# Patient Record
Sex: Male | Born: 1990 | Race: White | Hispanic: No | Marital: Single | Smoking: Former smoker
Health system: Southern US, Community
[De-identification: ages and names within clinical notes are randomized; demographics above are authoritative.]

## PROBLEM LIST (undated history)

## (undated) DIAGNOSIS — F32A Depression, unspecified: Secondary | ICD-10-CM

## (undated) DIAGNOSIS — K589 Irritable bowel syndrome without diarrhea: Secondary | ICD-10-CM

## (undated) DIAGNOSIS — K219 Gastro-esophageal reflux disease without esophagitis: Secondary | ICD-10-CM

## (undated) DIAGNOSIS — F329 Major depressive disorder, single episode, unspecified: Secondary | ICD-10-CM

## (undated) DIAGNOSIS — A0472 Enterocolitis due to Clostridium difficile, not specified as recurrent: Secondary | ICD-10-CM

## (undated) DIAGNOSIS — R7989 Other specified abnormal findings of blood chemistry: Secondary | ICD-10-CM

## (undated) HISTORY — PX: APPENDECTOMY: SHX54

## (undated) HISTORY — DX: Enterocolitis due to Clostridium difficile, not specified as recurrent: A04.72

## (undated) HISTORY — DX: Other specified abnormal findings of blood chemistry: R79.89

## (undated) HISTORY — DX: Gastro-esophageal reflux disease without esophagitis: K21.9

## (undated) HISTORY — DX: Irritable bowel syndrome, unspecified: K58.9

## (undated) HISTORY — DX: Depression, unspecified: F32.A

## (undated) HISTORY — DX: Major depressive disorder, single episode, unspecified: F32.9

---
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
## 2015-04-24 ENCOUNTER — Telehealth: Payer: Self-pay | Admitting: Internal Medicine

## 2015-04-24 NOTE — Telephone Encounter (Signed)
Tried to contact patient mom to notify we have scheduled patient for 3/3 at 1pm

## 2015-04-24 NOTE — Telephone Encounter (Signed)
States she has seen Dr. Lawerance Bach and wants her son to get in with Dr. Lawerance Bach.  He currently just moved her from [place] and only has a few weeks of testosterone left.  She is requesting Dr. Lawerance Bach to work her son in sooner for an establish care visit.  Please advise.

## 2015-05-03 ENCOUNTER — Other Ambulatory Visit (INDEPENDENT_AMBULATORY_CARE_PROVIDER_SITE_OTHER): Payer: PRIVATE HEALTH INSURANCE

## 2015-05-03 ENCOUNTER — Encounter: Payer: Self-pay | Admitting: Internal Medicine

## 2015-05-03 ENCOUNTER — Ambulatory Visit (INDEPENDENT_AMBULATORY_CARE_PROVIDER_SITE_OTHER): Payer: PRIVATE HEALTH INSURANCE | Admitting: Internal Medicine

## 2015-05-03 VITALS — BP 124/82 | HR 67 | Temp 98.4°F | Resp 16 | Ht 66.5 in | Wt 128.0 lb

## 2015-05-03 DIAGNOSIS — R0982 Postnasal drip: Secondary | ICD-10-CM

## 2015-05-03 DIAGNOSIS — M542 Cervicalgia: Secondary | ICD-10-CM

## 2015-05-03 DIAGNOSIS — K589 Irritable bowel syndrome without diarrhea: Secondary | ICD-10-CM

## 2015-05-03 DIAGNOSIS — Z113 Encounter for screening for infections with a predominantly sexual mode of transmission: Secondary | ICD-10-CM

## 2015-05-03 DIAGNOSIS — E291 Testicular hypofunction: Secondary | ICD-10-CM

## 2015-05-03 DIAGNOSIS — R7989 Other specified abnormal findings of blood chemistry: Secondary | ICD-10-CM

## 2015-05-03 DIAGNOSIS — Q550 Absence and aplasia of testis: Secondary | ICD-10-CM

## 2015-05-03 LAB — COMPREHENSIVE METABOLIC PANEL
ALBUMIN: 5.2 g/dL (ref 3.5–5.2)
ALT: 14 U/L (ref 0–53)
AST: 16 U/L (ref 0–37)
Alkaline Phosphatase: 46 U/L (ref 39–117)
BUN: 11 mg/dL (ref 6–23)
CHLORIDE: 104 meq/L (ref 96–112)
CO2: 27 mEq/L (ref 19–32)
CREATININE: 0.82 mg/dL (ref 0.40–1.50)
Calcium: 10.2 mg/dL (ref 8.4–10.5)
GFR: 124.12 mL/min (ref >60.00)
GLUCOSE: 80 mg/dL (ref 70–99)
Potassium: 3.7 mEq/L (ref 3.5–5.1)
SODIUM: 140 meq/L (ref 135–145)
TOTAL PROTEIN: 7.6 g/dL (ref 6.0–8.3)
Total Bilirubin: 1.1 mg/dL (ref 0.2–1.2)

## 2015-05-03 LAB — LIPID PANEL
Cholesterol: 154 mg/dL (ref 0–200)
HDL: 55.4 mg/dL (ref >39.00)
LDL CALC: 85 mg/dL (ref 0–99)
NONHDL: 98.2
Total CHOL/HDL Ratio: 3
Triglycerides: 68 mg/dL (ref 0.0–149.0)
VLDL: 13.6 mg/dL (ref 0.0–40.0)

## 2015-05-03 LAB — TSH: TSH: 0.78 u[IU]/mL (ref 0.35–4.50)

## 2015-05-03 LAB — CBC WITH DIFFERENTIAL/PLATELET
Basophils Absolute: 0 10*3/uL (ref 0.0–0.1)
Basophils Relative: 0.7 % (ref 0.0–3.0)
EOS ABS: 0.1 10*3/uL (ref 0.0–0.7)
Eosinophils Relative: 2.4 % (ref 0.0–5.0)
HCT: 43.9 % (ref 39.0–52.0)
HEMOGLOBIN: 15 g/dL (ref 13.0–17.0)
LYMPHS ABS: 1.9 10*3/uL (ref 0.7–4.0)
Lymphocytes Relative: 33.5 % (ref 12.0–46.0)
MCHC: 34.2 g/dL (ref 30.0–36.0)
MCV: 86.3 fl (ref 78.0–100.0)
Monocytes Absolute: 0.6 10*3/uL (ref 0.1–1.0)
Monocytes Relative: 10 % (ref 3.0–12.0)
NEUTROS PCT: 53.4 % (ref 43.0–77.0)
Neutro Abs: 3 10*3/uL (ref 1.4–7.7)
Platelets: 146 10*3/uL — ABNORMAL LOW (ref 150.0–400.0)
RBC: 5.09 Mil/uL (ref 4.22–5.81)
RDW: 12.8 % (ref 11.5–15.5)
WBC: 5.6 10*3/uL (ref 4.0–10.5)

## 2015-05-03 MED ORDER — TESTOSTERONE 20.25 MG/1.25GM (1.62%) TD GEL: TRANSDERMAL | Status: DC

## 2015-05-03 MED ORDER — ALPRAZOLAM 0.5 MG PO TABS: 0.5000 mg | ORAL_TABLET | Freq: Every evening | ORAL | Status: DC | PRN

## 2015-05-03 NOTE — Progress Notes (Signed)
Subjective:    Patient ID: Kyle Davenport, male    DOB: 11/09/1990, 25 y.o.   MRN: [id number]  HPI He is here to establish with a new pcp.  His mother is with him today.   IBS:  He was diagnosed at 25 years old based on symptoms. His typical symtpoms are stabbing pain and / or dairrhea. He rarely gets constipation.  He has flares about once a month, sometimes more.  Known trigerrs include red sauce, acidic sauces, some gluten, sometimes dairy.  The symptoms are not consistent with these foods.  His symptoms affect his quality of life and he would like to get them better controlled.  He wonders about a food allergy.    Weird sensation in neck:  It started about one year ago.  He has felt a couple of lumps in his neck. He feels pressure at times.  He has a lot of sinus drainage.  He takes a nasal spray and it does not help.  He has tried an otc anti-histamine but he does not take it because it dries him out too much.  His mohther has noticed that he is not straight - upper back and shoulders - she thinks he needs to be adjusted.    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  He has been on testosterone replacement since he was 13.  He uses androgel once a day.    Xanax:  He takes it for sleep.  When he was younger he had night terrors.  He does get anxiety attacks at night.  He takes it once a week maybe.     They would like his moles checked.   He is interested in being screened for stds.  Medications and allergies reviewed with patient and updated if appropriate.  Patient Active Problem List   Diagnosis Date Noted  . 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   No current facility-administered medications on file prior to visit.    Past Medical History  Diagnosis Date  . Depression   . GERD (gastroesophageal reflux disease)      History reviewed. No pertinent past surgical history.  Social History   Social History  . Marital Status: Single    Spouse Name: N/A  . Number of Children: N/A  . Years of Education: N/A   Social History Main Topics  . Smoking status: Current Some Day Smoker  . Smokeless tobacco: Never Used  . Alcohol Use: Yes  . Drug Use: No  . Sexual Activity: Not Asked   Other Topics Concern  . None   Social History Narrative  . None    Family History  Problem Relation Age of Onset  . Arthritis Maternal Aunt   . Hearing loss Maternal Uncle   . Hyperlipidemia Maternal Grandmother   . Hypertension Maternal Grandmother   . Alcohol abuse Maternal Grandfather   . Birth defects Maternal Grandfather     prost  . Diabetes Maternal Grandfather     Review of Systems  Constitutional: Negative for fever, appetite change, fatigue and unexpected weight change.       Gets chills  HENT: Positive for postnasal drip. Negative for congestion, ear pain (popping of ears frequently), sinus pressure and sore throat.   Respiratory: Negative for cough, shortness of breath and wheezing.   Cardiovascular: Negative for chest  pain, palpitations and leg swelling.  Gastrointestinal: Positive for abdominal pain, diarrhea (IBS related), constipation and blood in stool (has had it in stool, on tissue). Negative for nausea and vomiting.       GERD on occasion with IBS  Genitourinary: Negative for dysuria, hematuria and difficulty urinating.  Musculoskeletal: Negative for myalgias, back pain and arthralgias.  Skin: Negative for rash.  Neurological: Negative for dizziness, light-headedness and headaches.  Psychiatric/Behavioral: Negative for sleep disturbance and dysphoric mood. The patient is nervous/anxious (situational only).        Objective:   Filed Vitals:   05/03/15 1303  BP: 124/82  Pulse: 67  Temp: 98.4 F (36.9 C)  Resp: 16   Filed Weights   05/03/15 1303  Weight: 128 lb (58.06 kg)    Body mass index is 20.35 kg/(m^2).   Physical Exam Constitutional: He appears well-developed and well-nourished. No distress.  HENT:  Head: Normocephalic and atraumatic.  Right Ear: External ear normal.  Left Ear: External ear normal.  Mouth/Throat: Oropharynx is clear and moist.  Normal ear canals and TM b/l  Eyes: Conjunctivae  normal.  Neck: Neck supple. No tracheal deviation present. No thyromegaly present.  No carotid bruit  Cardiovascular: Normal rate, regular rhythm, normal heart sounds and intact distal pulses. No murmur heard. Pulmonary/Chest: Effort normal and breath sounds normal. No respiratory distress. He has no wheezes. He has no rales.  Abdominal: Soft. Bowel sounds are normal. He exhibits no distension. There is no tenderness.  Musculoskeletal: He exhibits no edema.  Lymphadenopathy:    He has no cervical adenopathy.  Skin: Skin is warm and dry. He is not diaphoretic. no abnormal moles.  Psychiatric: He has a normal mood and affect. His behavior is normal.       Assessment & Plan:   See Problem List for Assessment and Plan of chronic medical problems.  Follow up in 6 months

## 2015-05-03 NOTE — Patient Instructions (Addendum)
  We have reviewed your prior records including labs and tests today.  Test(s) ordered today. Your results will be released to MyChart (or called to you) after review, usually within 72hours after test completion. If any changes need to be made, you will be notified at that same time.  All other Health Maintenance issues reviewed.   All recommended immunizations and age-appropriate screenings are up-to-date.  No immunizations administered today.   Medications reviewed and updated.  No changes recommended at this time.  Your prescription(s) have been given to you/submitted to your pharmacy. Please take as directed and contact our office if you believe you are having problem(s) with the medication(s).  A referral was ordered for GI and ENT.  Please schedule followup in 6 months

## 2015-05-03 NOTE — Progress Notes (Signed)
Pre visit review using our clinic review tool, if applicable. No additional management support is needed unless otherwise documented below in the visit note. 

## 2015-05-04 DIAGNOSIS — Q550 Absence and aplasia of testis: Secondary | ICD-10-CM | POA: Insufficient documentation

## 2015-05-04 LAB — RPR

## 2015-05-04 LAB — HIV ANTIBODY (ROUTINE TESTING W REFLEX): HIV 1&2 Ab, 4th Generation: NONREACTIVE

## 2015-05-04 LAB — HEPATITIS C ANTIBODY: HCV AB: NEGATIVE

## 2015-05-04 NOTE — Assessment & Plan Note (Signed)
Will check food allergy panel and celiac screen Refer to GI

## 2015-05-04 NOTE — Assessment & Plan Note (Addendum)
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

## 2015-05-04 NOTE — Assessment & Plan Note (Signed)
Exam is normal - has been persistent for one year and concerns him Will refer to ENT for this and constant post nasal drip

## 2015-05-06 LAB — FOOD ALLERGY PANEL
Corn: <0.1 kU/L
Fish Cod: <0.1 kU/L
Milk IgE: <0.1 kU/L
Peanut IgE: <0.1 kU/L
Walnut: <0.1 kU/L
Wheat IgE: <0.1 kU/L

## 2015-05-06 LAB — HSV 2 ANTIBODY, IGG: HSV 2 Glycoprotein G Ab, IgG: <0.9 Index (ref <0.90)

## 2015-05-06 LAB — HSV 1 ANTIBODY, IGG: HSV 1 Glycoprotein G Ab, IgG: <0.9 Index (ref <0.90)

## 2015-05-06 LAB — SPECIMEN STATUS

## 2015-05-07 LAB — TESTOSTERONE, FREE, TOTAL, SHBG
Sex Hormone Binding: 37.5 nmol/L (ref 16.5–55.9)
TESTOSTERONE FREE: 18.4 pg/mL (ref 9.3–26.5)
Testosterone: 620 ng/dL (ref 348–1197)

## 2015-05-07 LAB — CELIAC DISEASE AB SCREEN W/RFX
ANTIGLIADIN ABS, IGA: 1 U (ref 0–19)
IgA/Immunoglobulin A, Serum: 117 mg/dL (ref 90–386)

## 2015-05-08 LAB — VITAMIN D 1,25 DIHYDROXY
VITAMIN D 1, 25 (OH) TOTAL: 27 pg/mL (ref 18–72)
Vitamin D3 1, 25 (OH)2: 27 pg/mL

## 2015-05-14 ENCOUNTER — Encounter: Payer: Self-pay | Admitting: Internal Medicine

## 2015-05-17 ENCOUNTER — Encounter: Payer: Self-pay | Admitting: *Deleted

## 2015-05-27 ENCOUNTER — Encounter: Payer: Self-pay | Admitting: Internal Medicine

## 2015-05-27 ENCOUNTER — Ambulatory Visit (INDEPENDENT_AMBULATORY_CARE_PROVIDER_SITE_OTHER): Payer: PRIVATE HEALTH INSURANCE | Admitting: Internal Medicine

## 2015-05-27 VITALS — BP 86/52 | HR 68 | Ht 65.75 in | Wt 129.4 lb

## 2015-05-27 DIAGNOSIS — R1030 Lower abdominal pain, unspecified: Secondary | ICD-10-CM

## 2015-05-27 DIAGNOSIS — K580 Irritable bowel syndrome with diarrhea: Secondary | ICD-10-CM

## 2015-05-27 MED ORDER — HYOSCYAMINE SULFATE 0.125 MG SL SUBL: 0.2500 mg | SUBLINGUAL_TABLET | Freq: Four times a day (QID) | SUBLINGUAL | Status: DC | PRN

## 2015-05-27 MED ORDER — NA SULFATE-K SULFATE-MG SULF 17.5-3.13-1.6 GM/177ML PO SOLN: ORAL | Status: DC

## 2015-05-27 NOTE — Patient Instructions (Addendum)
Your physician has requested that you go to the basement for the following lab work before leaving today: GI Pathogen Panel, calprotectin, fecal elactase  You have been scheduled for an endoscopy and colonoscopy. Please follow the written instructions given to you at your visit today. Please pick up your prep supplies at the pharmacy within the next 1-3 days. If you use inhalers (even only as needed), please bring them with you on the day of your procedure. Your physician has requested that you go to www.startemmi.com and enter the access code given to you at your visit today. This web site gives a general overview about your procedure. However, you should still follow specific instructions given to you by our office regarding your preparation for the procedure.  Please make sure to avoid lactose.  Please purchase the following medications over the counter and take as directed: Lactaid tablets (to use when lactose in your diet is unavoidable)   You have been scheduled for an appointment with Dr Madie RenoVanwinkle at [facility] on Tuesday 06/11/15 @ 1:45 pm. Their office is located at: [address], [place], [place] [address]. Phone number [phone]. Please make sure not to take any antihistamines 3 days prior to your appointment.

## 2015-05-27 NOTE — Progress Notes (Signed)
Patient ID: Kyle Davenport, male   DOB: [birth year], 25 y.o.   MRN: [id number] HPI: Kyle Davenport is a 73 -year-old male with a past medical history of IBS, history of C. difficile colitis after appendectomy 3 years ago, congenital anorchia who is seen in consultation at the request of Eileen Stanford to evaluate chronic diarrhea lower abdominal pain. He is here with his mother. He reports that he has had lifelong problems with his stomach which they feel his ear trouble bowel related. This dates back to when he was 25 years old. He describes flares of mid and lower abdominal pain which can be diffuse "stabbing" type abdominal pain. This occurs several days per week. It is associated with loose watery an urgent stools. During flares he can have nocturnal stools 3-5 times per night. Stress definitely makes symptoms worse and issues with his abdomen have caused him social stress. Also makes work and school difficult. On good days his stools are loose and occurring several times per morning. On bad days he can have 5-10 loose watery urgent stools. He is rarely seen blood in his stool occurring first about a month ago. Small volume with wiping. He does feel like he has hemorrhoids. He says he has not had a solid stool in "years". Not definitely diet trigger he is To food journal without definite correlation. Sometimes pizza will "destroyed my stomach" and at other times not cause a problem. He's used Imodium without benefit. Previously prescription for what sounds like Levsin provided minimal to moderate relief on some days. Denies heartburn, dysphagia or odynophagia. Rare nausea but no vomiting. Good appetite and stable weight.  Family history notable for multiple family members with gluten sensitivity but no definite celiac disease. His prior TTG was negative. He has a half-brother with Crohn's disease and a paternal grandmother with colitis.  He did have C. difficile colitis after appendectomy 3 years ago. This was  treated with what sounds like Flagyl  He is now living here after having moved home from [place] 2 months ago.  Past Medical History  Diagnosis Date  . Depression   . GERD (gastroesophageal reflux disease)   . IBS (irritable bowel syndrome)   . Low testosterone   . C. difficile colitis     Past Surgical History  Procedure Laterality Date  . Appendectomy    . Laperscopic look for testes  1996  . Testicle implants  2007    Outpatient Prescriptions Prior to Visit  Medication Sig Dispense Refill  . ALPRAZolam (XANAX) 0.5 MG tablet Take 1 tablet (0.5 mg total) by mouth at bedtime as needed for anxiety. 30 tablet 0  . Testosterone 20.25 MG/1.25GM (1.62%) GEL Apply twice daily 2.5 g 0   No facility-administered medications prior to visit.    Not on File  Family History  Problem Relation Age of Onset  . Arthritis Maternal Aunt   . Hearing loss Maternal Uncle   . Hyperlipidemia Maternal Grandmother   . Hypertension Maternal Grandmother   . Alcohol abuse Maternal Grandfather   . Birth defects Maternal Grandfather     prost  . Diabetes Maternal Grandfather   . Crohn's disease Brother   . Ulcerative colitis Paternal Grandmother     Social History  Substance Use Topics  . Smoking status: Current Some Day Smoker  . Smokeless tobacco: Never Used     Comment: rare 1-2 a week  . Alcohol Use: 0.0 oz/week    0 Standard drinks or equivalent per week  ROS: As per history of present illness, otherwise negative  BP 86/52 mmHg  Pulse 68  Ht 5' 5.75" (1.67 m)  Wt 129 lb 6 oz (58.684 kg)  BMI 21.04 kg/m2 Constitutional: Well-developed and well-nourished. No distress. HEENT: Normocephalic and atraumatic. Oropharynx is clear and moist. No oropharyngeal exudate. Conjunctivae are normal.  No scleral icterus. Neck: Neck supple. Trachea midline. Cardiovascular: Normal rate, regular rhythm and intact distal pulses. No M/R/G Pulmonary/chest: Effort normal and breath sounds normal. No  wheezing, rales or rhonchi. Abdominal: Soft, Lower abdominal tenderness without rebound or guarding, nondistended. Bowel sounds active throughout. There are no masses palpable. No hepatosplenomegaly. Extremities: no clubbing, cyanosis, or edema Lymphadenopathy: No cervical adenopathy noted. Neurological: Alert and oriented to person place and time. Skin: Skin is warm and dry. No rashes noted. Psychiatric: Normal mood and affect. Behavior is normal.  RELEVANT LABS AND IMAGING: CBC    Component Value Date/Time   WBC 5.6 05/03/2015 1407   WBC WILL FOLLOW 05/03/2015 1407   RBC 5.09 05/03/2015 1407   RBC WILL FOLLOW 05/03/2015 1407   HGB 15.0 05/03/2015 1407   HCT 43.9 05/03/2015 1407   HCT WILL FOLLOW 05/03/2015 1407   PLT 146.0* 05/03/2015 1407   PLT WILL FOLLOW 05/03/2015 1407   MCV 86.3 05/03/2015 1407   MCV WILL FOLLOW 05/03/2015 1407   MCH WILL FOLLOW 05/03/2015 1407   MCHC 34.2 05/03/2015 1407   MCHC WILL FOLLOW 05/03/2015 1407   RDW 12.8 05/03/2015 1407   RDW WILL FOLLOW 05/03/2015 1407   LYMPHSABS 1.9 05/03/2015 1407   LYMPHSABS WILL FOLLOW 05/03/2015 1407   MONOABS 0.6 05/03/2015 1407   EOSABS 0.1 05/03/2015 1407   EOSABS WILL FOLLOW 05/03/2015 1407   BASOSABS 0.0 05/03/2015 1407   BASOSABS WILL FOLLOW 05/03/2015 1407    CMP     Component Value Date/Time   NA 140 05/03/2015 1407   K 3.7 05/03/2015 1407   CL 104 05/03/2015 1407   CO2 27 05/03/2015 1407   GLUCOSE 80 05/03/2015 1407   BUN 11 05/03/2015 1407   CREATININE 0.82 05/03/2015 1407   CALCIUM 10.2 05/03/2015 1407   PROT 7.6 05/03/2015 1407   ALBUMIN 5.2 05/03/2015 1407   AST 16 05/03/2015 1407   ALT 14 05/03/2015 1407   ALKPHOS 46 05/03/2015 1407   BILITOT 1.1 05/03/2015 1407   TTG neg  Lab Results  Component Value Date   TSH 0.78 05/03/2015    ASSESSMENT/PLAN:  13 -year-old male with a past medical history of IBS, history of C. difficile colitis after appendectomy 3 years ago, congenital  anorchia who is seen in consultation at the request of Eileen Stanford to evaluate chronic diarrhea lower abdominal pain.  1. Lower abd pain/chronic diarrhea -- I recommended stool studies to include GI pathogen panel, fecal calprotectin, and fecal elastase.  I've also recommended that he avoid lactose which has seemed to trigger symptoms on occasion. He can use Lactaid tablets if meal contains lactose. Upper endoscopy and colonoscopy recommended for further evaluation of abdominal pain and chronic diarrhea. Plan small bowel and colon biopsies. Rule out IBD. We discussed the risk benefits and alternatives and he is agreeable to proceed. Prescription for Levsin 0.125 mg 2 tablets every 6 hours as needed for lower abdominal crampy pain. If endoscopies are unrevealing he would be a candidate for rifaximin for IBS-D or Viberzi. Finally after our discussion we will refer to allergy for food allergy testing to see if this is contributing to irritable bowel  response/symptoms.    ZO:XWRUECc:Stacy Hetty ElyJ Burns, Md [address] [place], [place] [address]

## 2015-06-03 ENCOUNTER — Other Ambulatory Visit: Payer: PRIVATE HEALTH INSURANCE

## 2015-06-03 DIAGNOSIS — K580 Irritable bowel syndrome with diarrhea: Secondary | ICD-10-CM

## 2015-06-04 LAB — GASTROINTESTINAL PATHOGEN PANEL PCR
C. DIFFICILE TOX A/B, PCR: NEGATIVE
CRYPTOSPORIDIUM, PCR: NEGATIVE
Campylobacter, PCR: NEGATIVE
E COLI (STEC) STX1/STX2, PCR: NEGATIVE
E coli (ETEC) LT/ST PCR: NEGATIVE
E coli 0157, PCR: NEGATIVE
GIARDIA LAMBLIA, PCR: NEGATIVE
NOROVIRUS, PCR: NEGATIVE
ROTAVIRUS, PCR: NEGATIVE
Salmonella, PCR: NEGATIVE
Shigella, PCR: NEGATIVE

## 2015-06-07 LAB — CALPROTECTIN: Calprotectin: <15.6 mcg/g (ref <=162.9)

## 2015-06-09 LAB — PANCREATIC ELASTASE, FECAL

## 2015-07-01 ENCOUNTER — Ambulatory Visit (AMBULATORY_SURGERY_CENTER): Payer: PRIVATE HEALTH INSURANCE | Admitting: Internal Medicine

## 2015-07-01 ENCOUNTER — Encounter: Payer: Self-pay | Admitting: Internal Medicine

## 2015-07-01 VITALS — BP 116/74 | HR 71 | Temp 98.4°F | Resp 11 | Ht 65.0 in | Wt 129.0 lb

## 2015-07-01 DIAGNOSIS — K529 Noninfective gastroenteritis and colitis, unspecified: Secondary | ICD-10-CM | POA: Diagnosis not present

## 2015-07-01 DIAGNOSIS — K2990 Gastroduodenitis, unspecified, without bleeding: Secondary | ICD-10-CM

## 2015-07-01 DIAGNOSIS — R1030 Lower abdominal pain, unspecified: Secondary | ICD-10-CM

## 2015-07-01 DIAGNOSIS — K2970 Gastritis, unspecified, without bleeding: Secondary | ICD-10-CM

## 2015-07-01 DIAGNOSIS — K2950 Unspecified chronic gastritis without bleeding: Secondary | ICD-10-CM | POA: Diagnosis not present

## 2015-07-01 MED ORDER — SODIUM CHLORIDE 0.9 % IV SOLN: 500.0000 mL | INTRAVENOUS | Status: DC

## 2015-07-01 NOTE — Patient Instructions (Signed)

## 2015-07-01 NOTE — Progress Notes (Signed)
A/ox3, pleased with MAC, report to RN 

## 2015-07-01 NOTE — Op Note (Signed)
[facility] Patient Name: Kyle Davenport Procedure Date: 07/01/2015 3:16 PM MRN: [id number] Endoscopist: Beverley Fiedler , MD Age: 25 Date of Birth: 1990/11/29 Gender: Male Procedure:                Upper GI endoscopy Indications:              Lower abdominal pain, Diarrhea Medicines:                Monitored Anesthesia Care Procedure:                Pre-Anesthesia Assessment:                           - Prior to the procedure, a History and Physical                            was performed, and patient medications and                            allergies were reviewed. The patient's tolerance of                            previous anesthesia was also reviewed. The risks                            and benefits of the procedure and the sedation                            options and risks were discussed with the patient.                            All questions were answered, and informed consent                            was obtained. Prior Anticoagulants: The patient has                            taken no previous anticoagulant or antiplatelet                            agents. ASA Grade Assessment: II - A patient with                            mild systemic disease. After reviewing the risks                            and benefits, the patient was deemed in                            satisfactory condition to undergo the procedure.                           After obtaining informed consent, the endoscope was  passed under direct vision. Throughout the                            procedure, the patient's blood pressure, pulse, and                            oxygen saturations were monitored continuously. The                            Model GIF-HQ190 [id number]) scope was introduced                            through the mouth, and advanced to the second part                            of duodenum. The upper GI endoscopy was                             accomplished without difficulty. The patient                            tolerated the procedure well. Scope In: Scope Out: Findings:                 The esophagus was normal.                           The stomach was normal.                           The examined duodenum was normal.                           Biopsies were taken with a cold forceps in the                            gastric body, at the incisura and in the gastric                            antrum for histology and Helicobacter pylori                            testing.                           Biopsies for histology were taken with a cold                            forceps in the second portion of the duodenum for                            evaluation of celiac disease. Complications:            No immediate complications. Estimated Blood Loss:     Estimated blood loss: none. Impression:               -  Normal esophagus.                           - Normal stomach.                           - Normal examined duodenum.                           - Biopsies were taken with a cold forceps for                            histology and Helicobacter pylori testing.                           - Biopsies were taken with a cold forceps for                            evaluation of celiac disease. Recommendation:           - Patient has a contact number available for                            emergencies. The signs and symptoms of potential                            delayed complications were discussed with the                            patient. Return to normal activities tomorrow.                            Written discharge instructions were provided to the                            patient.                           - Resume previous diet.                           - Continue present medications.                           - Await pathology results.                           - Perform a colonoscopy today. Beverley FiedlerJay M Pyrtle,  MD 07/01/2015 3:59:41 PM This report has been signed electronically.

## 2015-07-01 NOTE — Progress Notes (Signed)
Called to room to assist during endoscopic procedure.  Patient ID and intended procedure confirmed with present staff. Received instructions for my participation in the procedure from the performing physician.  

## 2015-07-01 NOTE — Op Note (Signed)
[facility] Patient Name: Kyle Davenport Procedure Date: 07/01/2015 3:16 PM MRN: [id number] Endoscopist: Beverley Fiedler , MD Age: 25 Date of Birth: Oct 30, 1990 Gender: Male Procedure:                Colonoscopy Indications:              Lower abdominal pain, Chronic diarrhea Medicines:                Monitored Anesthesia Care Procedure:                Pre-Anesthesia Assessment:                           - Prior to the procedure, a History and Physical                            was performed, and patient medications and                            allergies were reviewed. The patient's tolerance of                            previous anesthesia was also reviewed. The risks                            and benefits of the procedure and the sedation                            options and risks were discussed with the patient.                            All questions were answered, and informed consent                            was obtained. Prior Anticoagulants: The patient has                            taken no previous anticoagulant or antiplatelet                            agents. ASA Grade Assessment: II - A patient with                            mild systemic disease. After reviewing the risks                            and benefits, the patient was deemed in                            satisfactory condition to undergo the procedure.                           After obtaining informed consent, the colonoscope  was passed under direct vision. Throughout the                            procedure, the patient's blood pressure, pulse, and                            oxygen saturations were monitored continuously. The                            Model PCF-H190L [id number]) scope was introduced                            through the anus and advanced to the the terminal                            ileum. The colonoscopy was performed without          difficulty. The patient tolerated the procedure                            well. The quality of the bowel preparation was                            good. The terminal ileum, ileocecal valve,                            appendiceal orifice, and rectum were photographed. Scope In: 3:35:56 PM Scope Out: 3:53:09 PM Scope Withdrawal Time: 0 hours 6 minutes 33 seconds  Total Procedure Duration: 0 hours 17 minutes 13 seconds  Findings:                 The terminal ileum appeared normal.                           The entire examined colon appeared normal on direct                            and retroflexion views.                           Biopsies for histology were taken with a cold                            forceps from the right colon and left colon for                            evaluation of microscopic colitis. Complications:            No immediate complications. Estimated Blood Loss:     Estimated blood loss: none. Impression:               - The examined portion of the ileum was normal.                           - The entire examined colon is normal on direct and  retroflexion views.                           - Biopsies were taken with a cold forceps from the                            right colon and left colon for evaluation of                            microscopic colitis. Recommendation:           - Patient has a contact number available for                            emergencies. The signs and symptoms of potential                            delayed complications were discussed with the                            patient. Return to normal activities tomorrow.                            Written discharge instructions were provided to the                            patient.                           - Resume previous diet.                           - Continue present medications.                           - Await pathology results. If biopsies are                             unrevealing would recommend trial of rifaximin 550                            mg three times daily x 14 days for IBS-D.                           - Repeat colonoscopy at age 25 for screening                            purposes. Beverley FiedlerJay M Rik Wadel, MD 07/01/2015 4:03:21 PM This report has been signed electronically.

## 2015-07-02 ENCOUNTER — Telehealth: Payer: Self-pay

## 2015-07-02 NOTE — Telephone Encounter (Signed)
  Follow up Call-  Call back number 07/01/2015  Post procedure Call Back phone  # [phone]  Permission to leave phone message Yes    Patient was called for follow up after his procedure on 07/01/2015. No answer at the number given for follow up phone call. A message was left on the answering machine.

## 2015-07-17 ENCOUNTER — Telehealth: Payer: Self-pay | Admitting: Internal Medicine

## 2015-07-17 NOTE — Telephone Encounter (Signed)
Noted  

## 2015-08-01 ENCOUNTER — Telehealth: Payer: Self-pay | Admitting: Internal Medicine

## 2015-08-01 NOTE — Telephone Encounter (Signed)
Left message for pt to call back  °

## 2015-08-02 MED ORDER — RIFAXIMIN 550 MG PO TABS: 550.0000 mg | ORAL_TABLET | Freq: Three times a day (TID) | ORAL | Status: DC

## 2015-08-02 NOTE — Telephone Encounter (Signed)
Pt aware and script sent to [facility].

## 2015-09-10 ENCOUNTER — Ambulatory Visit: Payer: PRIVATE HEALTH INSURANCE | Admitting: Internal Medicine

## 2015-11-05 ENCOUNTER — Encounter (HOSPITAL_COMMUNITY): Payer: Self-pay | Admitting: Emergency Medicine

## 2015-11-05 ENCOUNTER — Ambulatory Visit: Payer: PRIVATE HEALTH INSURANCE | Admitting: Internal Medicine

## 2015-11-05 ENCOUNTER — Emergency Department (HOSPITAL_COMMUNITY)
Admission: EM | Admit: 2015-11-05 | Discharge: 2015-11-05 | Disposition: A | Discharge status: Home / Self Care | Payer: Managed Care, Other (non HMO) | Attending: Emergency Medicine | Admitting: Emergency Medicine

## 2015-11-05 ENCOUNTER — Emergency Department (HOSPITAL_COMMUNITY): Payer: Managed Care, Other (non HMO)

## 2015-11-05 DIAGNOSIS — F17200 Nicotine dependence, unspecified, uncomplicated: Secondary | ICD-10-CM | POA: Diagnosis not present

## 2015-11-05 DIAGNOSIS — K589 Irritable bowel syndrome without diarrhea: Secondary | ICD-10-CM | POA: Insufficient documentation

## 2015-11-05 DIAGNOSIS — R1013 Epigastric pain: Secondary | ICD-10-CM | POA: Diagnosis present

## 2015-11-05 LAB — CBC WITH DIFFERENTIAL/PLATELET
Basophils Absolute: 0.1 10*3/uL (ref 0.0–0.1)
Basophils Relative: 1 %
EOS PCT: 3 %
Eosinophils Absolute: 0.2 10*3/uL (ref 0.0–0.7)
HCT: 42.7 % (ref 39.0–52.0)
Hemoglobin: 14.6 g/dL (ref 13.0–17.0)
LYMPHS ABS: 2.2 10*3/uL (ref 0.7–4.0)
Lymphocytes Relative: 28 %
MCH: 29.3 pg (ref 26.0–34.0)
MCHC: 34.2 g/dL (ref 30.0–36.0)
MCV: 85.7 fL (ref 78.0–100.0)
MONO ABS: 0.8 10*3/uL (ref 0.1–1.0)
Monocytes Relative: 10 %
Neutro Abs: 4.7 10*3/uL (ref 1.7–7.7)
Neutrophils Relative %: 58 %
PLATELETS: 139 10*3/uL — AB (ref 150–400)
RBC: 4.98 MIL/uL (ref 4.22–5.81)
RDW: 12.6 % (ref 11.5–15.5)
WBC: 8 10*3/uL (ref 4.0–10.5)

## 2015-11-05 LAB — COMPREHENSIVE METABOLIC PANEL
ALT: 17 U/L (ref 17–63)
AST: 18 U/L (ref 15–41)
Albumin: 4.7 g/dL (ref 3.5–5.0)
Alkaline Phosphatase: 51 U/L (ref 38–126)
Anion gap: 6 (ref 5–15)
BUN: 16 mg/dL (ref 6–20)
CHLORIDE: 106 mmol/L (ref 101–111)
CO2: 26 mmol/L (ref 22–32)
Calcium: 9.9 mg/dL (ref 8.9–10.3)
Creatinine, Ser: 0.94 mg/dL (ref 0.61–1.24)
Glucose, Bld: 94 mg/dL (ref 65–99)
POTASSIUM: 3.9 mmol/L (ref 3.5–5.1)
Sodium: 138 mmol/L (ref 135–145)
Total Bilirubin: 1.1 mg/dL (ref 0.3–1.2)
Total Protein: 7.4 g/dL (ref 6.5–8.1)

## 2015-11-05 LAB — I-STAT CHEM 8, ED
BUN: 18 mg/dL (ref 6–20)
CHLORIDE: 102 mmol/L (ref 101–111)
Calcium, Ion: 1.2 mmol/L (ref 1.15–1.40)
Creatinine, Ser: 0.9 mg/dL (ref 0.61–1.24)
Glucose, Bld: 94 mg/dL (ref 65–99)
HEMATOCRIT: 42 % (ref 39.0–52.0)
Hemoglobin: 14.3 g/dL (ref 13.0–17.0)
POTASSIUM: 4 mmol/L (ref 3.5–5.1)
SODIUM: 140 mmol/L (ref 135–145)
TCO2: 24 mmol/L (ref 0–100)

## 2015-11-05 LAB — LIPASE, BLOOD: LIPASE: 35 U/L (ref 11–51)

## 2015-11-05 MED ORDER — IOPAMIDOL (ISOVUE-370) INJECTION 76%
INTRAVENOUS | Status: AC
  Administered 2015-11-05: 100 mL
  Filled 2015-11-05: qty 100

## 2015-11-05 MED ORDER — FENTANYL CITRATE (PF) 100 MCG/2ML IJ SOLN
50.0000 ug | Freq: Once | INTRAMUSCULAR | Status: DC
  Filled 2015-11-05: qty 2

## 2015-11-05 MED ORDER — SODIUM CHLORIDE 0.9 % IV BOLUS (SEPSIS)
1000.0000 mL | Freq: Once | INTRAVENOUS | Status: AC
  Administered 2015-11-05: 1000 mL via INTRAVENOUS

## 2015-11-05 MED ORDER — RANITIDINE HCL 150 MG/10ML PO SYRP
300.0000 mg | ORAL_SOLUTION | Freq: Once | ORAL | Status: AC
  Administered 2015-11-05: 300 mg via ORAL
  Filled 2015-11-05: qty 20

## 2015-11-05 MED ORDER — SUCRALFATE 1 GM/10ML PO SUSP
1.0000 g | Freq: Once | ORAL | Status: AC
  Administered 2015-11-05: 1 g via ORAL
  Filled 2015-11-05: qty 10

## 2015-11-05 MED ORDER — RANITIDINE HCL 15 MG/ML PO SYRP: 150.0000 mg | ORAL_SOLUTION | Freq: Two times a day (BID) | ORAL | 0 refills | Status: AC | PRN

## 2015-11-05 NOTE — ED Triage Notes (Signed)
Patient presents to the ED with c/o central chest pain which began earlier this morning approx 0200 - awakened patient from sleep.

## 2015-11-05 NOTE — ED Notes (Signed)
Patient transported to CT 

## 2015-11-05 NOTE — ED Provider Notes (Signed)
[facility]ENCY DEPT Provider Note   CSN: [id number] Arrival date & time: 11/05/15  4098  By signing my name below, I, Rosario Adie, attest that this documentation has been prepared under the direction and in the presence of Tomasita Crumble, MD. Electronically Signed: Rosario Adie, ED Scribe. 11/05/15. 3:54 AM.  History   Chief Complaint Chief Complaint  Patient presents with  . Chest Pain   The history is provided by the patient and the spouse.     HPI Comments: Kyle Davenport is a 25 y.o. male with a PMHx of IBS and GERD, who presents to the Emergency Department complaining of gradual onset, burning epigastric abdominal pain that radiates into his back onset ~2 hours PTA. He states that his current episode of pain woke him up from his sleep. He reports associated diarrhea secondary to his CP. His mother notes that the pt was doing a lot of extraneous yard work today prior to the onset of his symptoms. He has a hx of IBS, but notes that his pain is not similar to his hx of IBS. Pt has a PSHx of appendectomy. Denies SOB, nausea, emesis, tingling/pain x all four extremities, or any other associated symptoms.   Past Medical History:  Diagnosis Date  . C. difficile colitis   . Depression   . GERD (gastroesophageal reflux disease)   . IBS (irritable bowel syndrome)   . Low testosterone    Patient Active Problem List   Diagnosis Date Noted  . 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 Low testosterone 05/03/2015  . Neck discomfort 05/03/2015  . IBS (irritable bowel syndrome) 05/03/2015   Past Surgical History:  Procedure Laterality Date  . APPENDECTOMY    . laperscopic look for Testes  1996  . testicle implants  2007    Home Medications    Prior to Admission medications   Medication Sig Start Date End Date Taking? Authorizing Provider  ALPRAZolam Prudy Feeler) 0.5 MG tablet Take 1 tablet (0.5 mg total) by mouth at bedtime as needed for anxiety. 05/03/15  Yes Pincus Sanes,  MD  cetirizine (ZYRTEC) 10 MG tablet Take 10 mg by mouth daily.   Yes Historical Provider, MD  hyoscyamine (LEVSIN SL) 0.125 MG SL tablet Place 2 tablets (0.25 mg total) under the tongue every 6 (six) hours as needed. Patient taking differently: Place 0.25 mg under the tongue every 6 (six) hours as needed for cramping.  05/27/15  Yes Beverley Fiedler, MD  Testosterone 20.25 MG/1.25GM (1.62%) GEL Apply twice daily 05/03/15  Yes Pincus Sanes, MD   Family History Family History  Problem Relation Age of Onset  . Arthritis Maternal Aunt   . Hearing loss Maternal Uncle   . Hyperlipidemia Maternal Grandmother   . Hypertension Maternal Grandmother   . Alcohol abuse Maternal Grandfather   . Birth defects Maternal Grandfather     prost  . Diabetes Maternal Grandfather   . Crohn's disease Brother   . Ulcerative colitis Paternal Grandmother    Social History Social History  Substance Use Topics  . Smoking status: Current Some Day Smoker  . Smokeless tobacco: Never Used     Comment: rare 1-2 a week  . Alcohol use 0.0 oz/week   Allergies   Review of patient's allergies indicates no known allergies.  Review of Systems Review of Systems A complete 10 system review of systems was obtained and all systems are negative except as noted in the HPI and PMH.   Physical Exam Updated Vital  Signs BP 127/84   Pulse 77   Temp 98.2 F (36.8 C) (Oral)   Resp 14   Ht 5\' 5"  (1.651 m)   Wt 126 lb (57.2 kg)   SpO2 100%   BMI 20.97 kg/m   Physical Exam  Constitutional: He is oriented to person, place, and time. Vital signs are normal. He appears well-developed and well-nourished.  Non-toxic appearance. He does not appear ill. No distress.  HENT:  Head: Normocephalic and atraumatic.  Nose: Nose normal.  Mouth/Throat: Oropharynx is clear and moist. No oropharyngeal exudate.  Eyes: Conjunctivae and EOM are normal. Pupils are equal, round, and reactive to light. No scleral icterus.  Neck: Normal range of  motion. Neck supple. No tracheal deviation, no edema, no erythema and normal range of motion present. No thyroid mass and no thyromegaly present.  Cardiovascular: Normal rate, regular rhythm, S1 normal, S2 normal, normal heart sounds, intact distal pulses and normal pulses.  Exam reveals no gallop and no friction rub.   No murmur heard. Pulmonary/Chest: Effort normal and breath sounds normal. No respiratory distress. He has no wheezes. He has no rhonchi. He has no rales.  Abdominal: Soft. Normal appearance and bowel sounds are normal. He exhibits no distension, no ascites and no mass. There is no hepatosplenomegaly. There is no tenderness. There is no rebound, no guarding and no CVA tenderness.  Musculoskeletal: Normal range of motion. He exhibits no edema or tenderness.  Lymphadenopathy:    He has no cervical adenopathy.  Neurological: He is alert and oriented to person, place, and time. He has normal strength. No cranial nerve deficit or sensory deficit.  Skin: Skin is warm, dry and intact. No petechiae and no rash noted. He is not diaphoretic. No erythema. No pallor.  Nursing note and vitals reviewed.  ED Treatments / Results  DIAGNOSTIC STUDIES: Oxygen Saturation is 100% on RA, normal by my interpretation.  COORDINATION OF CARE: 3:53 AM-Discussed next steps with pt. Pt verbalized understanding and is agreeable with the plan.   Labs (all labs ordered are listed, but only abnormal results are displayed) Labs Reviewed  CBC WITH DIFFERENTIAL/PLATELET - Abnormal; Notable for the following:       Result Value   Platelets 139 (*)    All other components within normal limits  COMPREHENSIVE METABOLIC PANEL  LIPASE, BLOOD  I-STAT CHEM 8, ED    EKG  EKG Interpretation  Date/Time:  Tuesday November 05 2015 03:36:07 EDT Ventricular Rate:  63 PR Interval:    QRS Duration: 76 QT Interval:  392 QTC Calculation: 402 R Axis:   75 Text Interpretation:  Sinus rhythm RSR' in V1 or V2,  probably normal variant Early repolarization No old tracing to compare Confirmed by Erroll Luna [id number]) on 11/05/2015 4:07:23 AM      Radiology Ct Angio Chest/abd/pel For Dissection W And/or Wo Contrast  Result Date: 11/05/2015 CLINICAL DATA:  Acute onset of central chest pain. Initial encounter. EXAM: CT ANGIOGRAPHY CHEST, ABDOMEN AND PELVIS TECHNIQUE: Multidetector CT imaging through the chest, abdomen and pelvis was performed using the standard protocol during bolus administration of intravenous contrast. Multiplanar reconstructed images and MIPs were obtained and reviewed to evaluate the vascular anatomy. CONTRAST:  100 mL of Isovue 370 IV contrast COMPARISON:  None. FINDINGS: CTA CHEST FINDINGS There is no evidence of aortic dissection. There is no evidence of aneurysmal dilatation. No calcific atherosclerotic disease is seen. The great vessels are unremarkable in appearance. There is no evidence of pulmonary embolus. The  lungs are clear bilaterally. There is no evidence of significant focal consolidation, pleural effusion or pneumothorax. No masses are identified; no abnormal focal contrast enhancement is seen. The mediastinum is unremarkable in appearance. No mediastinal lymphadenopathy is seen. No pericardial effusion is identified. Residual thymic tissue is within normal limits. No axillary lymphadenopathy is seen. The visualized portions of the thyroid gland are unremarkable in appearance. The visualized portions of the liver and spleen are unremarkable. The visualized portions of the pancreas, gallbladder, stomach, adrenal glands and kidneys are within normal limits. No acute osseous abnormalities are seen. Review of the MIP images confirms the above findings. CTA ABDOMEN AND PELVIS FINDINGS There is no evidence of aortic dissection. There is no evidence of aneurysmal dilatation. No calcific atherosclerotic disease is seen. The celiac trunk, superior mesenteric artery, bilateral renal  arteries and inferior mesenteric artery appear patent. There are 3 right-sided renal arteries and 2 left-sided renal arteries. The inferior vena cava is grossly unremarkable in appearance, though difficult to fully assess given the phase of contrast enhancement. Vague soft tissue prominence about the branches of the superior mesentery artery are thought to reflect venous structures, not well assessed given the phase of contrast enhancement. The liver and spleen are unremarkable in appearance. The gallbladder is within normal limits. The pancreas and adrenal glands are unremarkable. The kidneys are unremarkable in appearance. There is no evidence of hydronephrosis. No renal or ureteral stones are seen. No perinephric stranding is appreciated. No free fluid is identified. The small bowel is unremarkable in appearance. The stomach is within normal limits. No acute vascular abnormalities are seen. The patient is status post appendectomy. There is suggestion of wall thickening along the proximal transverse colon, which could reflect a mild infectious or inflammatory process, or could simply reflect intraluminal contents. Would correlate with the patient's symptoms. The remainder of the colon is unremarkable. The bladder is moderately distended and grossly unremarkable. The prostate remains normal in size. No inguinal lymphadenopathy is seen. No acute osseous abnormalities are identified. Review of the MIP images confirms the above findings. IMPRESSION: 1. No evidence of aortic dissection. No evidence of aneurysmal dilatation. No calcific atherosclerotic disease seen. 2. No evidence of pulmonary embolus. 3. Unremarkable CTA of the chest. 4. Suggestion of wall thickening along the proximal transverse colon, which could reflect a mild infectious or inflammatory process, or could simply reflect intraluminal contents. Would correlate with the patient's symptoms. Electronically Signed   By: Roanna RaiderJeffery  Chang M.D.   On: 11/05/2015  05:44    Procedures Procedures (including critical care time)  Medications Ordered in ED Medications  fentaNYL (SUBLIMAZE) injection 50 mcg (50 mcg Intravenous Refused 11/05/15 0549)  sodium chloride 0.9 % bolus 1,000 mL (0 mLs Intravenous Stopped 11/05/15 0551)  ranitidine (ZANTAC) 150 MG/10ML syrup 300 mg (300 mg Oral Given 11/05/15 0421)  sucralfate (CARAFATE) 1 GM/10ML suspension 1 g (1 g Oral Given 11/05/15 0548)  iopamidol (ISOVUE-370) 76 % injection (100 mLs  Contrast Given 11/05/15 0509)    Initial Impression / Assessment and Plan / ED Course  I have reviewed the triage vital signs and the nursing notes.  Pertinent labs & imaging results that were available during my care of the patient were reviewed by me and considered in my medical decision making (see chart for details).  Clinical Course    Patient presents to the ED for chest and abd pain radiating to his back. He denies h/o GERD although it is in his chart.  He was given zantac  and sucralfate for treatment. 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    6:16 AM CT reveals thickening of the transverse colon, which corresponds with the patients symptoms of pain radiating to his back.  This is likely a flair of his IBS.  Education provided.  GI follow up advised.  He appears well and in NAD. VS remain within his normal limits and he is safe for DC.  Final Clinical Impressions(s) / ED Diagnoses   Final diagnoses:  IBS (irritable bowel syndrome)    New Prescriptions New Prescriptions   No medications on file    I personally performed the services described in this documentation, which was scribed in my presence. The recorded information has been reviewed and is accurate.       Tomasita Crumble, MD 11/05/15 [phone]

## 2015-11-08 ENCOUNTER — Ambulatory Visit: Payer: PRIVATE HEALTH INSURANCE | Admitting: Internal Medicine

## 2016-03-05 ENCOUNTER — Other Ambulatory Visit: Payer: Self-pay | Admitting: Internal Medicine

## 2016-03-10 ENCOUNTER — Telehealth: Payer: Self-pay | Admitting: *Deleted

## 2016-03-10 MED ORDER — TESTOSTERONE 20.25 MG/1.25GM (1.62%) TD GEL: TRANSDERMAL | 0 refills | Status: DC

## 2016-03-10 NOTE — Telephone Encounter (Signed)
Called pt to verify which pharmacy he would like rx sent. Pt request [facility] [address]. Faxed script to pharmacy...Raechel Chute/lmb

## 2016-03-10 NOTE — Telephone Encounter (Signed)
Rec'd call pt states he has made appt for 03/12/16, but needing to get a refill on the Andro Gel...Raechel Chute/lmb

## 2016-03-10 NOTE — Telephone Encounter (Signed)
Ok to fill 

## 2016-03-12 ENCOUNTER — Encounter: Payer: Self-pay | Admitting: Emergency Medicine

## 2016-03-12 ENCOUNTER — Ambulatory Visit (INDEPENDENT_AMBULATORY_CARE_PROVIDER_SITE_OTHER): Payer: PRIVATE HEALTH INSURANCE | Admitting: Internal Medicine

## 2016-03-12 ENCOUNTER — Encounter: Payer: Self-pay | Admitting: Internal Medicine

## 2016-03-12 ENCOUNTER — Other Ambulatory Visit (INDEPENDENT_AMBULATORY_CARE_PROVIDER_SITE_OTHER): Payer: PRIVATE HEALTH INSURANCE

## 2016-03-12 VITALS — BP 124/86 | HR 73 | Temp 98.8°F | Resp 16 | Ht 65.0 in | Wt 124.0 lb

## 2016-03-12 DIAGNOSIS — E349 Endocrine disorder, unspecified: Secondary | ICD-10-CM | POA: Diagnosis not present

## 2016-03-12 DIAGNOSIS — Z20828 Contact with and (suspected) exposure to other viral communicable diseases: Secondary | ICD-10-CM | POA: Diagnosis not present

## 2016-03-12 DIAGNOSIS — G479 Sleep disorder, unspecified: Secondary | ICD-10-CM

## 2016-03-12 DIAGNOSIS — R7989 Other specified abnormal findings of blood chemistry: Secondary | ICD-10-CM

## 2016-03-12 LAB — CBC WITH DIFFERENTIAL/PLATELET
BASOS ABS: 0 10*3/uL (ref 0.0–0.1)
Basophils Relative: 0.5 % (ref 0.0–3.0)
EOS ABS: 0.1 10*3/uL (ref 0.0–0.7)
Eosinophils Relative: 1.7 % (ref 0.0–5.0)
HCT: 43.9 % (ref 39.0–52.0)
HEMOGLOBIN: 15.3 g/dL (ref 13.0–17.0)
Lymphocytes Relative: 28.2 % (ref 12.0–46.0)
Lymphs Abs: 1.7 10*3/uL (ref 0.7–4.0)
MCHC: 34.8 g/dL (ref 30.0–36.0)
MCV: 84.8 fl (ref 78.0–100.0)
MONO ABS: 0.7 10*3/uL (ref 0.1–1.0)
Monocytes Relative: 10.7 % (ref 3.0–12.0)
Neutro Abs: 3.6 10*3/uL (ref 1.4–7.7)
Neutrophils Relative %: 58.9 % (ref 43.0–77.0)
Platelets: 158 10*3/uL (ref 150.0–400.0)
RBC: 5.18 Mil/uL (ref 4.22–5.81)
RDW: 13.2 % (ref 11.5–15.5)
WBC: 6.2 10*3/uL (ref 4.0–10.5)

## 2016-03-12 LAB — COMPREHENSIVE METABOLIC PANEL
ALBUMIN: 5.1 g/dL (ref 3.5–5.2)
ALT: 16 U/L (ref 0–53)
AST: 16 U/L (ref 0–37)
Alkaline Phosphatase: 47 U/L (ref 39–117)
BUN: 16 mg/dL (ref 6–23)
CHLORIDE: 103 meq/L (ref 96–112)
CO2: 31 mEq/L (ref 19–32)
CREATININE: 0.87 mg/dL (ref 0.40–1.50)
Calcium: 10.5 mg/dL (ref 8.4–10.5)
GFR: 115.05 mL/min (ref >60.00)
Glucose, Bld: 88 mg/dL (ref 70–99)
Potassium: 4.3 mEq/L (ref 3.5–5.1)
SODIUM: 141 meq/L (ref 135–145)
Total Bilirubin: 1.3 mg/dL — ABNORMAL HIGH (ref 0.2–1.2)
Total Protein: 7.7 g/dL (ref 6.0–8.3)

## 2016-03-12 MED ORDER — TESTOSTERONE 20.25 MG/1.25GM (1.62%) TD GEL
TRANSDERMAL | 5 refills | Status: DC
Start: 2016-03-12 — End: 2016-03-16

## 2016-03-12 MED ORDER — ALPRAZOLAM 0.5 MG PO TABS: 0.5000 mg | ORAL_TABLET | Freq: Every evening | ORAL | 0 refills | Status: DC | PRN

## 2016-03-12 MED ORDER — OSELTAMIVIR PHOSPHATE 75 MG PO CAPS: 75.0000 mg | ORAL_CAPSULE | Freq: Every day | ORAL | 0 refills | Status: DC

## 2016-03-12 NOTE — Patient Instructions (Addendum)
    Dr Emerson MonteParrish McKinney [address] [place], [place] [address] [phone]  [facility]  [address] [place], [place] [phone]  [facility]           [address] [place], [place] [phone]  Dr Lawerance Bachupinder Maryan [facility] [address] [place], [place] [address] [phone]  [facility]    - walk ins only for initial viist   M-F  8am - 3pm [address] [place], [place]  [phone]   Test(s) ordered today. Your results will be released to MyChart (or called to you) after review, usually within 72hours after test completion. If any changes need to be made, you will be notified at that same time.   Medications reviewed and updated.  Changes include starting tamiflu for flu prevention.   Your prescription(s) have been submitted to your pharmacy. Please take as directed and contact our office if you believe you are having problem(s) with the medication(s).   Please followup in 6 months

## 2016-03-12 NOTE — Assessment & Plan Note (Signed)
tamiflu prescribed

## 2016-03-12 NOTE — Assessment & Plan Note (Addendum)
Sometimes wakes up panic attacks Takes xanax as needed - 30 pills can last 6 months He is aware of the addiction potential Using medication appropriately Will refill today

## 2016-03-12 NOTE — Assessment & Plan Note (Signed)
Check labs taking less than prescribed currently - was afraid he would run out and it has been difficult to get refills No refill needed today Continue daily testosterone

## 2016-03-12 NOTE — Progress Notes (Signed)
Subjective:    Patient ID: Kyle Davenport, male    DOB: 06-19-1990, 26 y.o.   MRN: [id number]  HPI The patient is here for follow up.  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  He feels good and denies side effects.  He does not need a refill today.   Anxiety, sleep difficulties:  He wakes up sometimes in the middle of the night with a panic attack.  He will take xanax to help him get back to sleep.  He does not do it often and is aware of the addiction potential.  The medication works well and he has no side effects  His step father who he lives with has the flu.  He denies any symptoms, but wonders if he should do something.   Medications and allergies reviewed with patient and updated if appropriate.  Patient Active Problem List   Diagnosis Date Noted  . Sleep difficulties 03/12/2016  . 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 Low testosterone 05/03/2015  . Neck discomfort 05/03/2015  . IBS (irritable bowel syndrome) 05/03/2015    Current Outpatient Prescriptions on File Prior to Visit  Medication Sig Dispense Refill  . cetirizine (ZYRTEC) 10 MG tablet Take 10 mg by mouth daily.    . hyoscyamine (LEVSIN SL) 0.125 MG SL tablet Place 2 tablets (0.25 mg total) under the tongue every 6 (six) hours as needed. (Patient taking differently: Place 0.25 mg under the tongue every 6 (six) hours as needed for cramping. ) 30 tablet 0  . ranitidine (ZANTAC) 15 MG/ML syrup Take 10 mLs (150 mg total) by mouth 2 (two) times daily as needed for heartburn. 473 mL 0   No current facility-administered medications on file prior to visit.     Past Medical History:  Diagnosis Date  . C. difficile colitis   . Depression   . GERD (gastroesophageal reflux disease)   . IBS (irritable bowel syndrome)   . Low testosterone     Past Surgical History:  Procedure Laterality Date  . APPENDECTOMY    . laperscopic look for Testes  1996  .  testicle implants  2007    Social History   Social History  . Marital status: Single    Spouse name: N/A  . Number of children: N/A  . Years of education: N/A   Social History Main Topics  . Smoking status: Current Some Day Smoker  . Smokeless tobacco: Never Used     Comment: rare 1-2 a week  . Alcohol use 0.0 oz/week  . Drug use: No  . Sexual activity: Not on file   Other Topics Concern  . Not on file   Social History Narrative  . No narrative on file    Family History  Problem Relation Age of Onset  . Arthritis Maternal Aunt   . Hearing loss Maternal Uncle   . Hyperlipidemia Maternal Grandmother   . Hypertension Maternal Grandmother   . Alcohol abuse Maternal Grandfather   . Birth defects Maternal Grandfather     prost  . Diabetes Maternal Grandfather   . Crohn's disease Brother   . Ulcerative colitis Paternal Grandmother     Review of Systems  Constitutional: Negative for fever.  Respiratory: Negative for cough, shortness of breath and wheezing.   Cardiovascular: Negative for chest pain, palpitations and leg swelling.  Neurological: Negative for light-headedness and headaches.  Psychiatric/Behavioral: Positive for dysphoric mood.       Objective:  Vitals:   03/12/16 1057  BP: 124/86  Pulse: 73  Resp: 16  Temp: 98.8 F (37.1 C)   Wt Readings from Last 3 Encounters:  03/12/16 124 lb (56.2 kg)  11/05/15 126 lb (57.2 kg)  07/01/15 129 lb (58.5 kg)   Body mass index is 20.63 kg/m.   Physical Exam    Constitutional: Appears well-developed and well-nourished. No distress.  HENT:  Head: Normocephalic and atraumatic.  Neck: Neck supple. No tracheal deviation present. No thyromegaly present.  No cervical lymphadenopathy Cardiovascular: Normal rate, regular rhythm and normal heart sounds.   No murmur heard. No carotid bruit .  No edema Pulmonary/Chest: Effort normal and breath sounds normal. No respiratory distress. No has no wheezes. No rales.    Abdomen: soft, non tender Skin: Skin is warm and dry. Not diaphoretic.  Psychiatric: Normal mood and affect. Behavior is normal.      Assessment & Plan:    See Problem List for Assessment and Plan of chronic medical problems.    FU in 6 months

## 2016-03-13 ENCOUNTER — Encounter: Payer: Self-pay | Admitting: Internal Medicine

## 2016-03-13 NOTE — Progress Notes (Incomplete)
Kyle Davenport,  Your kidney function, liver tests and blood counts are normal.  Your testosterone level is very low.  Make sure you are back to taking your correct dose.  Your testosterone level 10 months ago was 620 and is now 38 (normal is 264-916).  Let me know if you have any questions or concerns.   Dr. Cheryll CockayneStacy Danaja Davenport.

## 2016-03-14 LAB — TESTOSTERONE, FREE, TOTAL, SHBG
Sex Hormone Binding: 33.4 nmol/L (ref 16.5–55.9)
TESTOSTERONE: 38 ng/dL — AB (ref 264–916)
Testosterone, Free: 3 pg/mL — ABNORMAL LOW (ref 9.3–26.5)

## 2016-03-16 ENCOUNTER — Telehealth: Payer: Self-pay

## 2016-03-16 MED ORDER — TESTOSTERONE 20.25 MG/1.25GM (1.62%) TD GEL: 40.0000 mg | Freq: Two times a day (BID) | TRANSDERMAL | 5 refills | Status: DC

## 2016-03-16 NOTE — Telephone Encounter (Signed)
Please advise 

## 2016-03-16 NOTE — Telephone Encounter (Signed)
[facility] pharmacy rq clarification for the testosterone prescription.  The sig is to apply twice a day but the pharmacist stated that it is written as if they are to dispense the packets.   Please contact them with clarification.

## 2016-03-16 NOTE — Telephone Encounter (Signed)
resent

## 2016-06-13 ENCOUNTER — Other Ambulatory Visit: Payer: Self-pay | Admitting: Internal Medicine

## 2016-06-15 NOTE — Telephone Encounter (Signed)
RX faxed to [facility] 

## 2016-06-16 ENCOUNTER — Other Ambulatory Visit: Payer: Self-pay | Admitting: Emergency Medicine

## 2016-06-16 MED ORDER — TESTOSTERONE 20.25 MG/ACT (1.62%) TD GEL: TRANSDERMAL | 1 refills | Status: DC

## 2016-06-16 NOTE — Progress Notes (Unsigned)
Spoke with pt to verify that Rx was received. States he would prefer the androgel pump be sent it bc it is easier to use. RX faxed to [facility]

## 2016-08-10 ENCOUNTER — Other Ambulatory Visit: Payer: Self-pay | Admitting: *Deleted

## 2016-08-10 NOTE — Telephone Encounter (Signed)
[place] controlled substance database checked.  Ok to fill medication.  

## 2016-08-10 NOTE — Telephone Encounter (Signed)
Rec'd call pt requesting refill on Testosterone gel...Kyle Davenport/lmb

## 2016-08-11 MED ORDER — TESTOSTERONE 20.25 MG/1.25GM (1.62%) TD GEL: 40.0000 mg | Freq: Two times a day (BID) | TRANSDERMAL | 5 refills | Status: DC

## 2016-08-11 NOTE — Telephone Encounter (Signed)
Printed script, MD signed, notified pt rx has been approved and faxed to [facility]...Raechel Chute/lmb

## 2016-09-10 ENCOUNTER — Ambulatory Visit: Payer: PRIVATE HEALTH INSURANCE | Admitting: Internal Medicine

## 2016-10-04 NOTE — Patient Instructions (Addendum)
  Test(s) ordered today. Your results will be released to MyChart (or called to you) after review, usually within 72hours after test completion. If any changes need to be made, you will be notified at that same time.  Medications reviewed and updated.  No changes recommended at this time.    Please followup in 6 months   

## 2016-10-04 NOTE — Progress Notes (Signed)
Subjective:    Patient ID: Kyle Davenport, male    DOB: 08/02/1990, 26 y.o.   MRN: [id number]  HPI The patient is here for follow up.  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  He denies side effects from the medication. He feels the dose is adequate and overall feels well.  Sleep difficulties, panic attack:  He occasionally wakes up in a panic attack - he takes xanax when this occurs.  He does not take it often is actually taking it less than previous. He is working a lot and feels that is keeping his anxiety well-controlled..  The medication works well and he denies side effects.   Medications and allergies reviewed with patient and updated if appropriate.  Patient Active Problem List   Diagnosis Date Noted  . Sleep difficulties 03/12/2016  . Exposure to the flu 03/12/2016  . 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 Low testosterone 05/03/2015  . Neck discomfort 05/03/2015  . IBS (irritable bowel syndrome) 05/03/2015    Current Outpatient Prescriptions on File Prior to Visit  Medication Sig Dispense Refill  . ALPRAZolam (XANAX) 0.5 MG tablet Take 1 tablet (0.5 mg total) by mouth at bedtime as needed for anxiety. 30 tablet 0  . cetirizine (ZYRTEC) 10 MG tablet Take 10 mg by mouth daily.    . ranitidine (ZANTAC) 15 MG/ML syrup Take 10 mLs (150 mg total) by mouth 2 (two) times daily as needed for heartburn. 473 mL 0  . Testosterone (ANDROGEL PUMP) 20.25 MG/ACT (1.62%) GEL Apply 2 pumps twice daily as directed 75 g 1  . Testosterone 20.25 MG/1.25GM (1.62%) GEL Apply 40 mg topically 2 (two) times daily. 2.5 g 5   No current facility-administered medications on file prior to visit.     Past Medical History:  Diagnosis Date  . C. difficile colitis   . Depression   . GERD (gastroesophageal reflux disease)   . IBS (irritable bowel syndrome)   . Low testosterone     Past Surgical History:  Procedure Laterality  Date  . APPENDECTOMY    . laperscopic look for Testes  1996  . testicle implants  2007    Social History   Social History  . Marital status: Single    Spouse name: N/A  . Number of children: N/A  . Years of education: N/A   Social History Main Topics  . Smoking status: Current Some Day Smoker  . Smokeless tobacco: Never Used     Comment: rare 1-2 a week  . Alcohol use 0.0 oz/week  . Drug use: No  . Sexual activity: Not Asked   Other Topics Concern  . None   Social History Narrative  . None    Family History  Problem Relation Age of Onset  . Arthritis Maternal Aunt   . Hearing loss Maternal Uncle   . Hyperlipidemia Maternal Grandmother   . Hypertension Maternal Grandmother   . Alcohol abuse Maternal Grandfather   . Birth defects Maternal Grandfather        prost  . Diabetes Maternal Grandfather   . Crohn's disease Brother   . Ulcerative colitis Paternal Grandmother     Review of Systems  Constitutional: Negative for chills and fever.  Respiratory: Negative for cough, shortness of breath and wheezing.   Cardiovascular: Negative for chest pain, palpitations and leg swelling.  Neurological: Positive for headaches (allergy ). Negative for light-headedness.       Objective:  Vitals:   10/05/16 1600  BP: 122/78  Pulse: 70  Resp: 16  Temp: 98.5 F (36.9 C)   Wt Readings from Last 3 Encounters:  10/05/16 125 lb (56.7 kg)  03/12/16 124 lb (56.2 kg)  11/05/15 126 lb (57.2 kg)   Body mass index is 20.8 kg/m.   Physical Exam    Constitutional: Appears well-developed and well-nourished. No distress.  HENT:  Head: Normocephalic and atraumatic.  Neck: Neck supple. No tracheal deviation present. No thyromegaly present.  No cervical lymphadenopathy Cardiovascular: Normal rate, regular rhythm and normal heart sounds.   No murmur heard. No carotid bruit .  No edema Pulmonary/Chest: Effort normal and breath sounds normal. No respiratory distress. No has no  wheezes. No rales.  Skin: Skin is warm and dry. Not diaphoretic.  Psychiatric: Normal mood and affect. Behavior is normal.      Assessment & Plan:    See Problem List for Assessment and Plan of chronic medical problems.

## 2016-10-05 ENCOUNTER — Ambulatory Visit (INDEPENDENT_AMBULATORY_CARE_PROVIDER_SITE_OTHER): Payer: PRIVATE HEALTH INSURANCE | Admitting: Internal Medicine

## 2016-10-05 ENCOUNTER — Other Ambulatory Visit (INDEPENDENT_AMBULATORY_CARE_PROVIDER_SITE_OTHER): Payer: PRIVATE HEALTH INSURANCE

## 2016-10-05 ENCOUNTER — Encounter: Payer: Self-pay | Admitting: Internal Medicine

## 2016-10-05 VITALS — BP 122/78 | HR 70 | Temp 98.5°F | Resp 16 | Wt 125.0 lb

## 2016-10-05 DIAGNOSIS — Q550 Absence and aplasia of testis: Secondary | ICD-10-CM

## 2016-10-05 DIAGNOSIS — R7989 Other specified abnormal findings of blood chemistry: Secondary | ICD-10-CM

## 2016-10-05 DIAGNOSIS — G479 Sleep disorder, unspecified: Secondary | ICD-10-CM | POA: Diagnosis not present

## 2016-10-05 LAB — CBC WITH DIFFERENTIAL/PLATELET
BASOS ABS: 0.1 10*3/uL (ref 0.0–0.1)
Basophils Relative: 1 % (ref 0.0–3.0)
Eosinophils Absolute: 0.2 10*3/uL (ref 0.0–0.7)
Eosinophils Relative: 2.6 % (ref 0.0–5.0)
HEMATOCRIT: 44 % (ref 39.0–52.0)
Hemoglobin: 14.7 g/dL (ref 13.0–17.0)
LYMPHS PCT: 29.2 % (ref 12.0–46.0)
Lymphs Abs: 2.3 10*3/uL (ref 0.7–4.0)
MCHC: 33.5 g/dL (ref 30.0–36.0)
MCV: 88.1 fl (ref 78.0–100.0)
MONOS PCT: 9.5 % (ref 3.0–12.0)
Monocytes Absolute: 0.7 10*3/uL (ref 0.1–1.0)
NEUTROS ABS: 4.5 10*3/uL (ref 1.4–7.7)
Neutrophils Relative %: 57.7 % (ref 43.0–77.0)
PLATELETS: 138 10*3/uL — AB (ref 150.0–400.0)
RBC: 4.99 Mil/uL (ref 4.22–5.81)
RDW: 12.8 % (ref 11.5–15.5)
WBC: 7.8 10*3/uL (ref 4.0–10.5)

## 2016-10-05 LAB — COMPREHENSIVE METABOLIC PANEL
ALT: 16 U/L (ref 0–53)
AST: 15 U/L (ref 0–37)
Albumin: 4.8 g/dL (ref 3.5–5.2)
Alkaline Phosphatase: 51 U/L (ref 39–117)
BILIRUBIN TOTAL: 0.5 mg/dL (ref 0.2–1.2)
BUN: 15 mg/dL (ref 6–23)
CALCIUM: 9.8 mg/dL (ref 8.4–10.5)
CO2: 28 meq/L (ref 19–32)
Chloride: 104 mEq/L (ref 96–112)
Creatinine, Ser: 0.88 mg/dL (ref 0.40–1.50)
GFR: 113 mL/min (ref >60.00)
Glucose, Bld: 87 mg/dL (ref 70–99)
POTASSIUM: 4.2 meq/L (ref 3.5–5.1)
Sodium: 139 mEq/L (ref 135–145)
Total Protein: 7.4 g/dL (ref 6.0–8.3)

## 2016-10-05 MED ORDER — TESTOSTERONE 20.25 MG/ACT (1.62%) TD GEL: TRANSDERMAL | 1 refills | Status: DC

## 2016-10-05 NOTE — Assessment & Plan Note (Signed)
Continue testosterone replacement Check CBC, CMP, testosterone Follow-up in 6 months

## 2016-10-05 NOTE — Assessment & Plan Note (Signed)
Takes Xanax on occasion for panic attacks at night, when sleeping Taking it less often in previous-he is working a lot and that helps control his anxiety level Continue Xanax as needed-does not need a refill today  Follow up in 6 months

## 2016-10-05 NOTE — Assessment & Plan Note (Signed)
Continue testosterone replacement Check CBC, CMP, testosterone Follow-up in 6 months 

## 2016-10-06 LAB — TESTOSTERONE, FREE, TOTAL, SHBG
SEX HORMONE BINDING: 35.8 nmol/L (ref 16.5–55.9)
Testosterone, Free: 8.6 pg/mL — ABNORMAL LOW (ref 9.3–26.5)
Testosterone: 323 ng/dL (ref 264–916)

## 2016-10-07 ENCOUNTER — Encounter: Payer: Self-pay | Admitting: Internal Medicine

## 2016-10-10 MED ORDER — TESTOSTERONE 20.25 MG/ACT (1.62%) TD GEL
TRANSDERMAL | 1 refills | Status: DC
Start: 2016-10-10 — End: 2017-02-02

## 2017-02-01 ENCOUNTER — Telehealth: Payer: Self-pay | Admitting: Internal Medicine

## 2017-02-01 NOTE — Telephone Encounter (Signed)
Copied from CRM [phone]. Topic: Quick Communication - Rx Refill/Question >> Feb 01, 2017  9:00 AM Crist InfanteHarrald, Kathy J wrote: Has the patient contacted their pharmacy? {yes  [place] and [place] this med transferred there.  Due to it being a controlled substance, needs us to send Testosterone (ANDROGEL PUMP) 20.25 MG/ACT (1.62%) GEL  Preferred Pharmacy (with phone number or street name):  [facility]   We got disconnected.  Unable to call pt back due to high call volume   Agent: Please be advised that RX refills may take up to 48 hours. We ask that you follow-up with your pharmacy. >> Feb 01, 2017  9:10 AM Cecelia ByarsGreen, Temeka L, RMA wrote: Patient was disconnected from previous agent he called back to give [facility] information[facility] [address], [place], [place] [id number]

## 2017-02-02 ENCOUNTER — Telehealth: Payer: Self-pay | Admitting: Internal Medicine

## 2017-02-02 MED ORDER — TESTOSTERONE 20.25 MG/ACT (1.62%) TD GEL: TRANSDERMAL | 0 refills | Status: DC

## 2017-02-02 NOTE — Telephone Encounter (Signed)
Prescription sent to pharmacy.

## 2017-02-02 NOTE — Addendum Note (Signed)
Addended by: Pincus SanesBURNS, STACY J on: 02/02/2017 12:55 PM   Modules accepted: Orders

## 2017-02-02 NOTE — Telephone Encounter (Signed)
Copied from CRM #[id number]. Topic: Quick Communication - Rx Refill/Question °>> Feb 01, 2017  9:00 AM Harrald, Kathy J wrote: °Has the patient contacted their pharmacy? {yes ° °Pt is moving to [place] and needs this med transferred there.  Due to it being a controlled substance, needs us to send °Testosterone (ANDROGEL PUMP) 20.25 MG/ACT (1.62%) GEL ° °Preferred Pharmacy (with phone number or street name):  °[facility]   °We got disconnected.  °Unable to call pt back due to high call volume ° ° °Agent: Please be advised that RX refills may take up to 48 hours. We ask that you follow-up with your pharmacy. °>> Feb 01, 2017  9:10 AM Green, Temeka L, RMA wrote: °Patient was disconnected from previous agent he called back to give pharmacy information: °[facility] [address], [place] [address] ° °

## 2017-02-02 NOTE — Telephone Encounter (Signed)
Called pt no answer LMOM MD [facility] [place]...Raechel Chute/lmb

## 2017-02-02 NOTE — Telephone Encounter (Signed)
Pls advise on msg below.../lmb 

## 2017-06-03 NOTE — Patient Instructions (Addendum)
  Test(s) ordered today. Your results will be released to MyChart (or called to you) after review, usually within 72hours after test completion. If any changes need to be made, you will be notified at that same time.   Medications reviewed and updated.  No changes recommended at this time.  Your prescription(s) have been submitted to your pharmacy. Please take as directed and contact our office if you believe you are having problem(s) with the medication(s).  A referral was ordered for urology.  Please followup in 6 months

## 2017-06-03 NOTE — Progress Notes (Signed)
Subjective:    Patient ID: Kyle Davenport, male    DOB: 1990-08-17, 27 y.o.   MRN: [id number]  HPI The patient is here for follow up.  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  He has tolerated this dose well and denies any side effects.  Overall he feels well on his current dose.  He does wonder if there are other options for supplementing his testosterone.  He does find it difficult to apply 3 times a day with his busy schedule.  Sleep difficulties, anxiety/panic attacks: Occasionally he wakes up with a panic attack and he will take Xanax when this happens.  He does not take it often and feels he is taking it less.  He is very busy with work and has been eating more healthy.  Overall he thinks this has helped improve his anxiety and his sleep.  Medications and allergies reviewed with patient and updated if appropriate.  Patient Active Problem List   Diagnosis Date Noted  . Sleep difficulties 03/12/2016  . 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 Low testosterone 05/03/2015  . Neck discomfort 05/03/2015  . IBS (irritable bowel syndrome) 05/03/2015    Current Outpatient Medications on File Prior to Visit  Medication Sig Dispense Refill  . ALPRAZolam (XANAX) 0.5 MG tablet Take 1 tablet (0.5 mg total) by mouth at bedtime as needed for anxiety. 30 tablet 0  . cetirizine (ZYRTEC) 10 MG tablet Take 10 mg by mouth daily.    . ranitidine (ZANTAC) 15 MG/ML syrup Take 10 mLs (150 mg total) by mouth 2 (two) times daily as needed for heartburn. 473 mL 0   No current facility-administered medications on file prior to visit.     Past Medical History:  Diagnosis Date  . C. difficile colitis   . Depression   . GERD (gastroesophageal reflux disease)   . IBS (irritable bowel syndrome)   . Low testosterone     Past Surgical History:  Procedure Laterality Date  . APPENDECTOMY    . laperscopic look for Testes  1996  . testicle  implants  2007    Social History   Socioeconomic History  . Marital status: Single    Spouse name: Not on file  . Number of children: Not on file  . Years of education: Not on file  . Highest education level: Not on file  Occupational History  . Not on file  Social Needs  . Financial resource strain: Not on file  . Food insecurity:    Worry: Not on file    Inability: Not on file  . Transportation needs:    Medical: Not on file    Non-medical: Not on file  Tobacco Use  . Smoking status: Former Games developer  . Smokeless tobacco: Never Used  . Tobacco comment: rare 1-2 a week  Substance and Sexual Activity  . Alcohol use: Yes    Alcohol/week: 0.0 oz    Comment: social  . Drug use: Yes    Types: Marijuana  . Sexual activity: Not on file  Lifestyle  . Physical activity:    Days per week: Not on file    Minutes per session: Not on file  . Stress: Not on file  Relationships  . Social connections:    Talks on phone: Not on file    Gets together: Not on file    Attends religious service: Not on file    Active member of club or organization:  Not on file    Attends meetings of clubs or organizations: Not on file    Relationship status: Not on file  Other Topics Concern  . Not on file  Social History Narrative  . Not on file    Family History  Problem Relation Age of Onset  . Arthritis Maternal Aunt   . Hearing loss Maternal Uncle   . Hyperlipidemia Maternal Grandmother   . Hypertension Maternal Grandmother   . Alcohol abuse Maternal Grandfather   . Birth defects Maternal Grandfather        prost  . Diabetes Maternal Grandfather   . Crohn's disease Brother   . Ulcerative colitis Paternal Grandmother     Review of Systems  Constitutional: Negative for chills and fever.  Respiratory: Negative for shortness of breath.   Cardiovascular: Negative for chest pain, palpitations and leg swelling.  Neurological: Negative for dizziness, light-headedness and headaches.    Psychiatric/Behavioral: Positive for sleep disturbance (Rare). Negative for dysphoric mood. The patient is nervous/anxious.        Objective:   Vitals:   06/04/17 1405  BP: 124/72  Pulse: 74  Resp: 16  Temp: 99 F (37.2 C)  SpO2: 98%   BP Readings from Last 3 Encounters:  06/04/17 124/72  10/05/16 122/78  03/12/16 124/86   Wt Readings from Last 3 Encounters:  06/04/17 124 lb (56.2 kg)  10/05/16 125 lb (56.7 kg)  03/12/16 124 lb (56.2 kg)   Body mass index is 20.63 kg/m.   Physical Exam    Constitutional: Appears well-developed and well-nourished. No distress.  HENT:  Head: Normocephalic and atraumatic.  Neck: Neck supple. No tracheal deviation present. No thyromegaly present.  No cervical lymphadenopathy Cardiovascular: Normal rate, regular rhythm and normal heart sounds.   No murmur heard. No carotid bruit .  No edema Pulmonary/Chest: Effort normal and breath sounds normal. No respiratory distress. No has no wheezes. No rales.  Skin: Skin is warm and dry. Not diaphoretic.  Psychiatric: Normal mood and affect. Behavior is normal.      Assessment & Plan:    See Problem List for Assessment and Plan of chronic medical problems.

## 2017-06-04 ENCOUNTER — Ambulatory Visit (INDEPENDENT_AMBULATORY_CARE_PROVIDER_SITE_OTHER): Payer: PRIVATE HEALTH INSURANCE | Admitting: Internal Medicine

## 2017-06-04 ENCOUNTER — Other Ambulatory Visit (INDEPENDENT_AMBULATORY_CARE_PROVIDER_SITE_OTHER): Payer: PRIVATE HEALTH INSURANCE

## 2017-06-04 ENCOUNTER — Encounter: Payer: Self-pay | Admitting: Internal Medicine

## 2017-06-04 VITALS — BP 124/72 | HR 74 | Temp 99.0°F | Resp 16 | Wt 124.0 lb

## 2017-06-04 DIAGNOSIS — Q550 Absence and aplasia of testis: Secondary | ICD-10-CM

## 2017-06-04 DIAGNOSIS — R7989 Other specified abnormal findings of blood chemistry: Secondary | ICD-10-CM

## 2017-06-04 DIAGNOSIS — G479 Sleep disorder, unspecified: Secondary | ICD-10-CM | POA: Diagnosis not present

## 2017-06-04 LAB — CBC WITH DIFFERENTIAL/PLATELET
BASOS PCT: 0.8 % (ref 0.0–3.0)
Basophils Absolute: 0.1 10*3/uL (ref 0.0–0.1)
EOS PCT: 1.7 % (ref 0.0–5.0)
Eosinophils Absolute: 0.1 10*3/uL (ref 0.0–0.7)
HEMATOCRIT: 43.5 % (ref 39.0–52.0)
HEMOGLOBIN: 14.9 g/dL (ref 13.0–17.0)
Lymphocytes Relative: 24.9 % (ref 12.0–46.0)
Lymphs Abs: 2.2 10*3/uL (ref 0.7–4.0)
MCHC: 34.3 g/dL (ref 30.0–36.0)
MCV: 86.9 fl (ref 78.0–100.0)
Monocytes Absolute: 0.7 10*3/uL (ref 0.1–1.0)
Monocytes Relative: 7.9 % (ref 3.0–12.0)
Neutro Abs: 5.7 10*3/uL (ref 1.4–7.7)
Neutrophils Relative %: 64.7 % (ref 43.0–77.0)
Platelets: 138 10*3/uL — ABNORMAL LOW (ref 150.0–400.0)
RBC: 5 Mil/uL (ref 4.22–5.81)
RDW: 12.8 % (ref 11.5–15.5)
WBC: 8.9 10*3/uL (ref 4.0–10.5)

## 2017-06-04 LAB — COMPREHENSIVE METABOLIC PANEL
ALBUMIN: 4.9 g/dL (ref 3.5–5.2)
ALK PHOS: 48 U/L (ref 39–117)
ALT: 12 U/L (ref 0–53)
AST: 15 U/L (ref 0–37)
BUN: 21 mg/dL (ref 6–23)
CALCIUM: 9.7 mg/dL (ref 8.4–10.5)
CHLORIDE: 103 meq/L (ref 96–112)
CO2: 27 mEq/L (ref 19–32)
Creatinine, Ser: 0.8 mg/dL (ref 0.40–1.50)
GFR: 125.44 mL/min (ref >60.00)
Glucose, Bld: 85 mg/dL (ref 70–99)
POTASSIUM: 3.9 meq/L (ref 3.5–5.1)
Sodium: 138 mEq/L (ref 135–145)
TOTAL PROTEIN: 7.5 g/dL (ref 6.0–8.3)
Total Bilirubin: 1 mg/dL (ref 0.2–1.2)

## 2017-06-04 MED ORDER — TESTOSTERONE 20.25 MG/ACT (1.62%) TD GEL: TRANSDERMAL | 0 refills | Status: DC

## 2017-06-04 NOTE — Assessment & Plan Note (Signed)
Continue testosterone replacement Description sent to pharmacy He is interested in learning what other options he has-we will refer to urology for this Check CBC, CMP, testosterone level

## 2017-06-04 NOTE — Assessment & Plan Note (Signed)
As above Continue current testosterone replacement Will refer to urology to consider other options Blood work today

## 2017-06-04 NOTE — Assessment & Plan Note (Signed)
Sleep overall improved and less anxiety/panic attacks We will continue alprazolam as needed, but really takes

## 2017-06-07 LAB — TESTOSTERONE, FREE & TOTAL
Free Testosterone: 33.8 pg/mL — ABNORMAL LOW (ref 35.0–155.0)
Testosterone, Total, LC-MS-MS: 250 ng/dL (ref 250–1100)

## 2017-06-09 ENCOUNTER — Encounter: Payer: Self-pay | Admitting: Internal Medicine

## 2017-07-29 IMAGING — CT CT ANGIO CHEST-ABD-PELV FOR DISSECTION W/ AND WO/W CM
2 of 8 series · 13 of 46 positions shown, 15 images · IV contrast (OMNI 350)
Comparison: None.

CLINICAL DATA: Acute onset of central chest pain. Initial
encounter.

EXAM:
CT ANGIOGRAPHY CHEST, ABDOMEN AND PELVIS
TECHNIQUE: Multidetector CT imaging through the chest, abdomen and pelvis was
performed using the standard protocol during bolus administration of
intravenous contrast. Multiplanar reconstructed images and MIPs were
obtained and reviewed to evaluate the vascular anatomy.
CONTRAST:  100 mL of Isovue 370 IV contrast

[Series 6: dissection 2mm · axial · 0.73mm/px · z∈[-593,-39]mm · 10 of 317 slices shown, 12 images]
[im 20/317  soft-tissue]
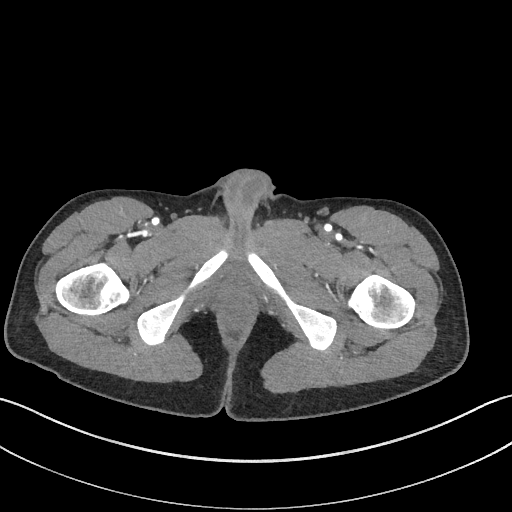
[im 20/317  bone]
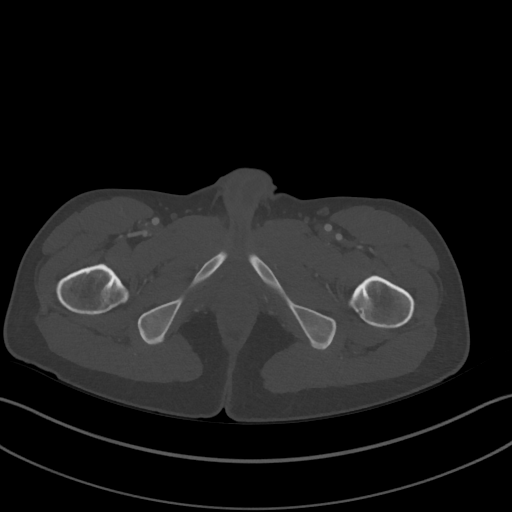
[im 60/317  soft-tissue]
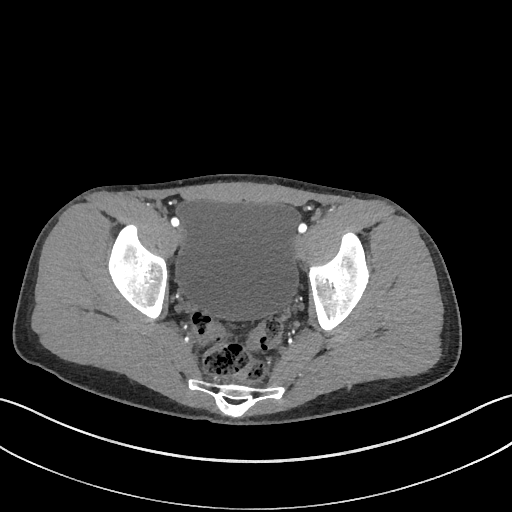
[im 80/317  soft-tissue]
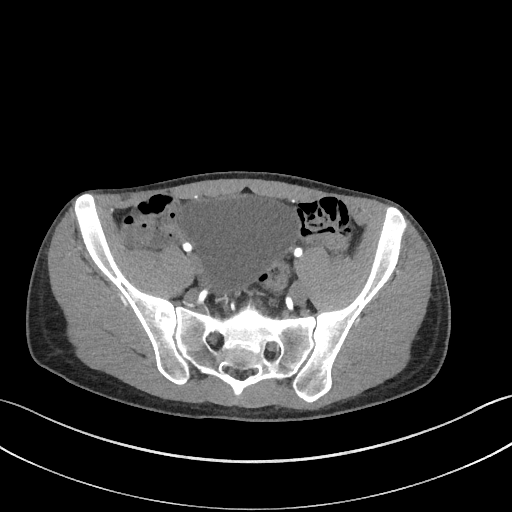
[im 119/317  soft-tissue]
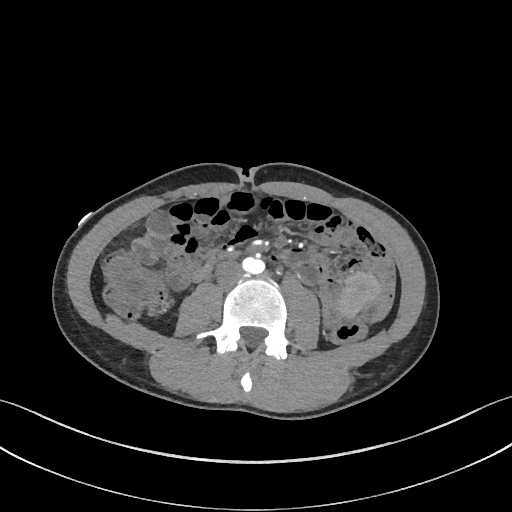
[im 139/317  soft-tissue]
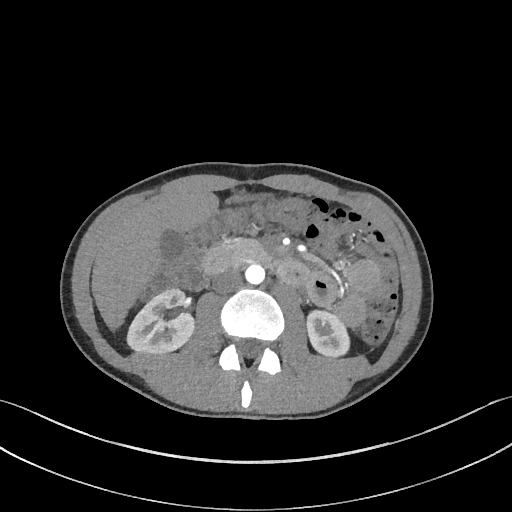
[im 178/317  soft-tissue]
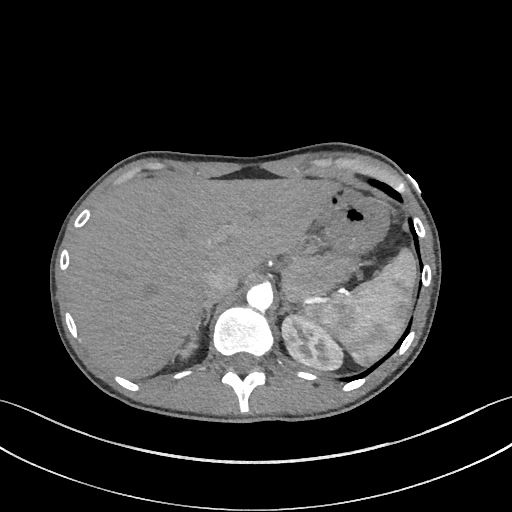
[im 198/317  soft-tissue]
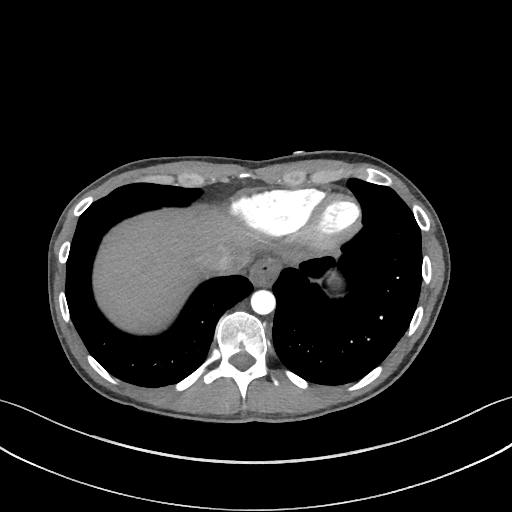
[im 238/317  soft-tissue]
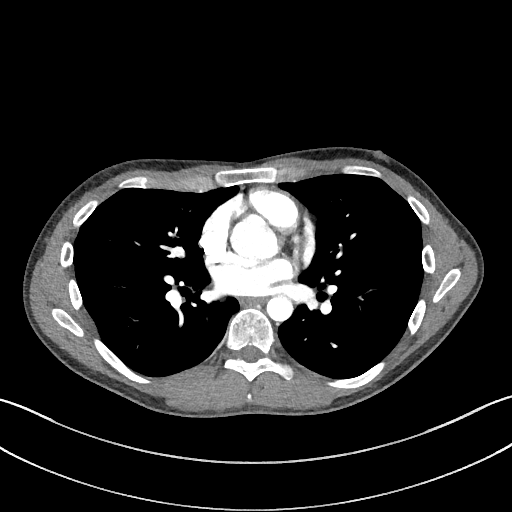
[im 257/317  soft-tissue]
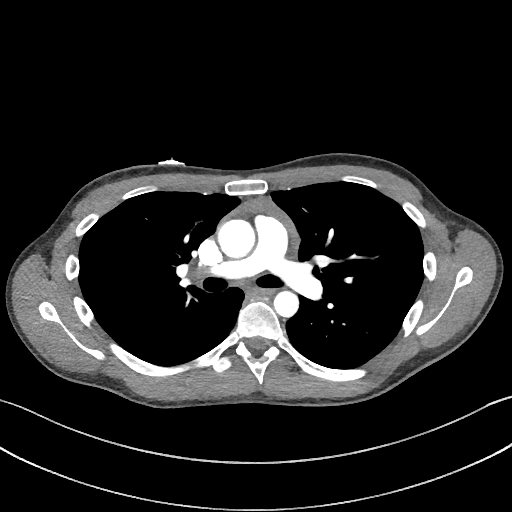
[im 257/317  bone]
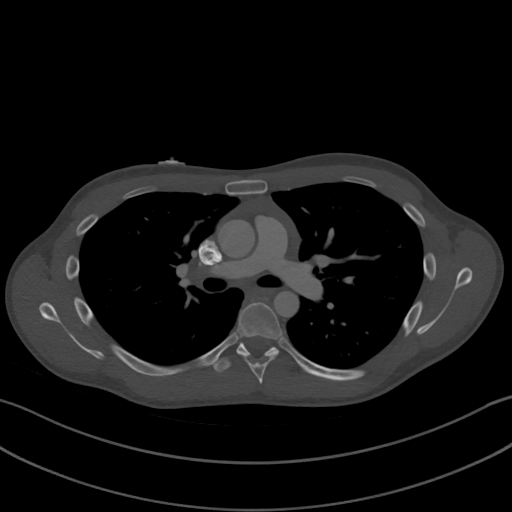
[im 297/317  soft-tissue]
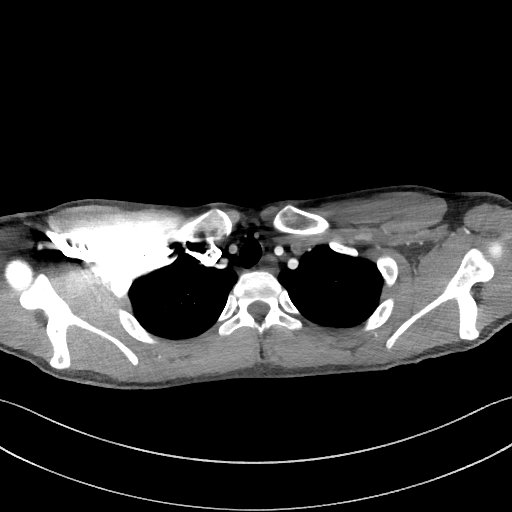

[Series 9: dissection 2mm cor · coronal · 0.71mm/px · 3 of 92 slices shown]
[im 23/92  soft-tissue]
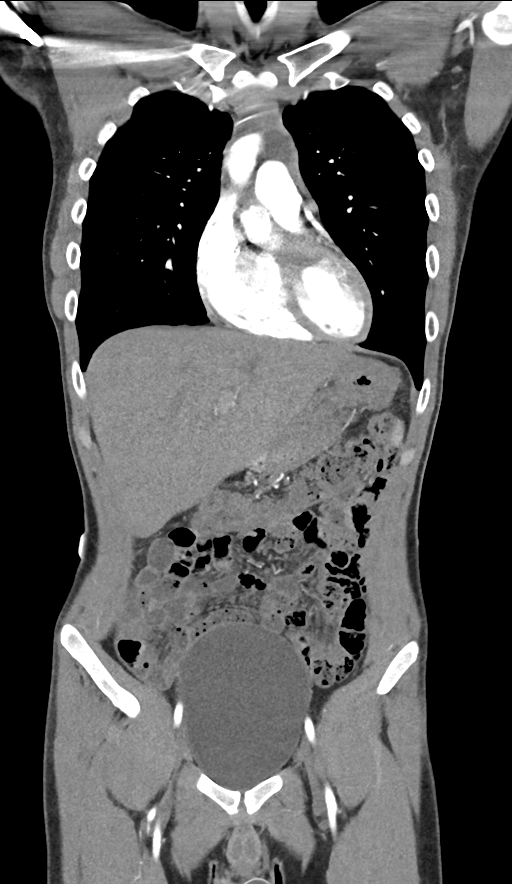
[im 46/92  soft-tissue]
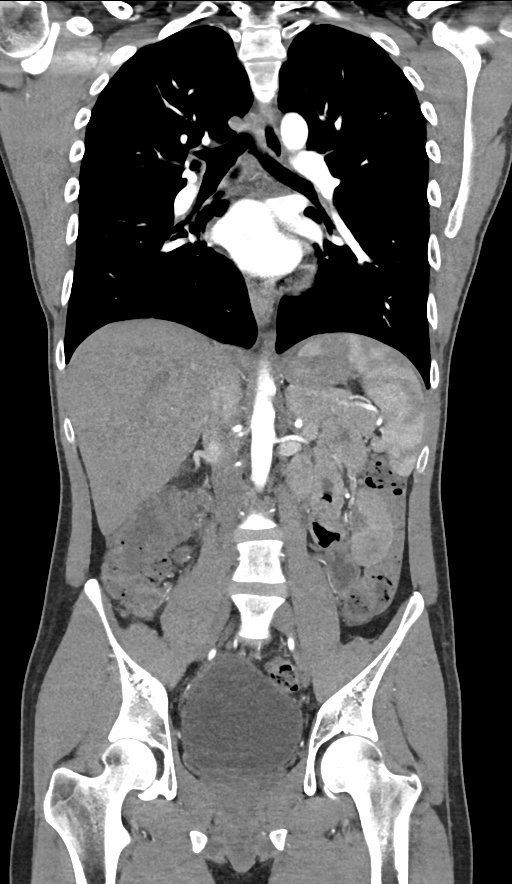
[im 69/92  soft-tissue]
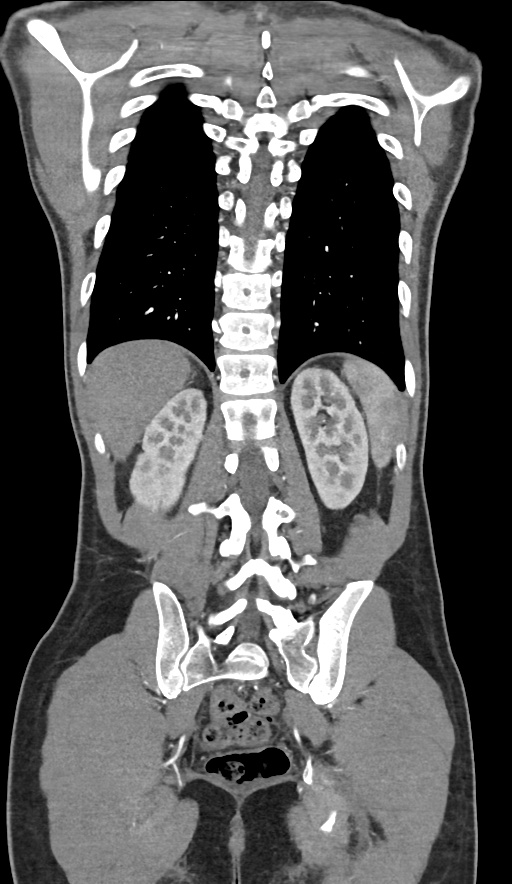

[13 of 46 positions shown; findings below may reference images not displayed]

FINDINGS: CTA CHEST FINDINGS

There is no evidence of aortic dissection. There is no evidence of
aneurysmal dilatation. No calcific atherosclerotic disease is seen.
The great vessels are unremarkable in appearance.

There is no evidence of pulmonary embolus.

The lungs are clear bilaterally. There is no evidence of significant
focal consolidation, pleural effusion or pneumothorax. No masses are
identified; no abnormal focal contrast enhancement is seen.

The mediastinum is unremarkable in appearance. No mediastinal
lymphadenopathy is seen. No pericardial effusion is identified.
Residual thymic tissue is within normal limits. No axillary
lymphadenopathy is seen. The visualized portions of the thyroid
gland are unremarkable in appearance.

The visualized portions of the liver and spleen are unremarkable.
The visualized portions of the pancreas, gallbladder, stomach,
adrenal glands and kidneys are within normal limits.

No acute osseous abnormalities are seen.

Review of the MIP images confirms the above findings.

CTA ABDOMEN AND PELVIS FINDINGS

There is no evidence of aortic dissection. There is no evidence of
aneurysmal dilatation. No calcific atherosclerotic disease is seen.
The celiac trunk, superior mesenteric artery, bilateral renal
arteries and inferior mesenteric artery appear patent. There are 3
right-sided renal arteries and 2 left-sided renal arteries. The
inferior vena cava is grossly unremarkable in appearance, though
difficult to fully assess given the phase of contrast enhancement.

Vague soft tissue prominence about the branches of the superior
mesentery artery are thought to reflect venous structures, not well
assessed given the phase of contrast enhancement.

The liver and spleen are unremarkable in appearance. The gallbladder
is within normal limits. The pancreas and adrenal glands are
unremarkable.

The kidneys are unremarkable in appearance. There is no evidence of
hydronephrosis. No renal or ureteral stones are seen. No perinephric
stranding is appreciated.

No free fluid is identified. The small bowel is unremarkable in
appearance. The stomach is within normal limits. No acute vascular
abnormalities are seen.

The patient is status post appendectomy. There is suggestion of wall
thickening along the proximal transverse colon, which could reflect
a mild infectious or inflammatory process, or could simply reflect
intraluminal contents. Would correlate with the patient's symptoms.
The remainder of the colon is unremarkable.

The bladder is moderately distended and grossly unremarkable. The
prostate remains normal in size. No inguinal lymphadenopathy is
seen.

No acute osseous abnormalities are identified.

Review of the MIP images confirms the above findings.
IMPRESSION: 1. No evidence of aortic dissection. No evidence of aneurysmal
dilatation. No calcific atherosclerotic disease seen.
2. No evidence of pulmonary embolus.
3. Unremarkable CTA of the chest.
4. Suggestion of wall thickening along the proximal transverse
colon, which could reflect a mild infectious or inflammatory
process, or could simply reflect intraluminal contents. Would
correlate with the patient's symptoms.

## 2017-09-01 ENCOUNTER — Encounter: Payer: Self-pay | Admitting: Internal Medicine

## 2017-09-06 NOTE — Progress Notes (Signed)
Subjective:    Patient ID: Kyle Davenport, male    DOB: 18-Mar-1990, 26 y.o.   MRN: [id number]  HPI The patient is here for follow up.  Low testosterone:  He has been better about applying the testosterone daily - he is doing 4 pumps a day.  It is hard for him to do more.  He wonders what other options there are.  I referred him to urology at his last visit, but lost his phone for a short time and missed their call.    Sleep difficulties: sleep is good.  He rarely takes xanax and only takes it if needed.    Nosebleeds:  Always left nostril.    He thinks it is dryness from taking his allergy medication daily.  He tried saline nasal spray but it has not helped and he has also uses a humidifier.    Upper abdominal pain:  It started three days ago. He has had one episode daily for the past three days. The pain was severe.  It will last for 2-3 hours and then will go away.  He ate a spicy chicken sandwich before one episode.  He was concerned about his gallbladder.    Medications and allergies reviewed with patient and updated if appropriate.  Patient Active Problem List   Diagnosis Date Noted  . Sleep difficulties 03/12/2016  . 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 Low testosterone 05/03/2015  . Neck discomfort 05/03/2015  . IBS (irritable bowel syndrome) 05/03/2015    Current Outpatient Medications on File Prior to Visit  Medication Sig Dispense Refill  . ALPRAZolam (XANAX) 0.5 MG tablet Take 1 tablet (0.5 mg total) by mouth at bedtime as needed for anxiety. 30 tablet 0  . cetirizine (ZYRTEC) 10 MG tablet Take 10 mg by mouth daily.    . ranitidine (ZANTAC) 15 MG/ML syrup Take 10 mLs (150 mg total) by mouth 2 (two) times daily as needed for heartburn. 473 mL 0  . Testosterone (ANDROGEL PUMP) 20.25 MG/ACT (1.62%) GEL Apply 3 pumps twice daily as directed 150 g 0   No current facility-administered medications on file prior to visit.     Past Medical History:  Diagnosis Date  .  C. difficile colitis   . Depression   . GERD (gastroesophageal reflux disease)   . IBS (irritable bowel syndrome)   . Low testosterone     Past Surgical History:  Procedure Laterality Date  . APPENDECTOMY    . laperscopic look for Testes  1996  . testicle implants  2007    Social History   Socioeconomic History  . Marital status: Single    Spouse name: Not on file  . Number of children: Not on file  . Years of education: Not on file  . Highest education level: Not on file  Occupational History  . Not on file  Social Needs  . Financial resource strain: Not on file  . Food insecurity:    Worry: Not on file    Inability: Not on file  . Transportation needs:    Medical: Not on file    Non-medical: Not on file  Tobacco Use  . Smoking status: Former Games developer  . Smokeless tobacco: Never Used  . Tobacco comment: rare 1-2 a week  Substance and Sexual Activity  . Alcohol use: Yes    Alcohol/week: 0.0 oz    Comment: social  . Drug use: Yes    Types: Marijuana  . Sexual activity: Not on file  Lifestyle  . Physical activity:    Days per week: Not on file    Minutes per session: Not on file  . Stress: Not on file  Relationships  . Social connections:    Talks on phone: Not on file    Gets together: Not on file    Attends religious service: Not on file    Active member of club or organization: Not on file    Attends meetings of clubs or organizations: Not on file    Relationship status: Not on file  Other Topics Concern  . Not on file  Social History Narrative  . Not on file    Family History  Problem Relation Age of Onset  . Arthritis Maternal Aunt   . Hearing loss Maternal Uncle   . Hyperlipidemia Maternal Grandmother   . Hypertension Maternal Grandmother   . Alcohol abuse Maternal Grandfather   . Birth defects Maternal Grandfather        prost  . Diabetes Maternal Grandfather   . Crohn's disease Brother   . Ulcerative colitis Paternal Grandmother      Review of Systems  Constitutional: Positive for chills (mild) and fever (mild, subjective).  Respiratory: Negative for cough, shortness of breath and wheezing.   Cardiovascular: Positive for palpitations. Negative for chest pain and leg swelling.  Gastrointestinal: Positive for abdominal pain, nausea (x 2 days) and vomiting (reflux - x 1). Negative for blood in stool, constipation and diarrhea.  Neurological: Negative for light-headedness and headaches.       Objective:   Vitals:   09/07/17 1320  BP: 124/82  Pulse: 81  Resp: 16  Temp: 98.8 F (37.1 C)  SpO2: 99%   BP Readings from Last 3 Encounters:  09/07/17 124/82  06/04/17 124/72  10/05/16 122/78   Wt Readings from Last 3 Encounters:  09/07/17 122 lb (55.3 kg)  06/04/17 124 lb (56.2 kg)  10/05/16 125 lb (56.7 kg)   Body mass index is 20.3 kg/m.   Physical Exam    Constitutional: Appears well-developed and well-nourished. No distress.  HENT:  Head: Normocephalic and atraumatic.  Neck: Neck supple. No tracheal deviation present. No thyromegaly present.  No cervical lymphadenopathy Cardiovascular: Normal rate, regular rhythm and normal heart sounds.   No murmur heard. No carotid bruit .  No edema Pulmonary/Chest: Effort normal and breath sounds normal. No respiratory distress. No has no wheezes. No rales.  Abdomen: soft, NT, ND Skin: Skin is warm and dry. Not diaphoretic.  Psychiatric: Normal mood and affect. Behavior is normal.      Assessment & Plan:    See Problem List for Assessment and Plan of chronic medical problems.

## 2017-09-07 ENCOUNTER — Ambulatory Visit (INDEPENDENT_AMBULATORY_CARE_PROVIDER_SITE_OTHER): Payer: PRIVATE HEALTH INSURANCE | Admitting: Internal Medicine

## 2017-09-07 ENCOUNTER — Encounter: Payer: Self-pay | Admitting: Internal Medicine

## 2017-09-07 ENCOUNTER — Other Ambulatory Visit (INDEPENDENT_AMBULATORY_CARE_PROVIDER_SITE_OTHER): Payer: PRIVATE HEALTH INSURANCE

## 2017-09-07 VITALS — BP 124/82 | HR 81 | Temp 98.8°F | Resp 16 | Wt 122.0 lb

## 2017-09-07 DIAGNOSIS — R7989 Other specified abnormal findings of blood chemistry: Secondary | ICD-10-CM

## 2017-09-07 DIAGNOSIS — R1013 Epigastric pain: Secondary | ICD-10-CM | POA: Diagnosis not present

## 2017-09-07 DIAGNOSIS — R040 Epistaxis: Secondary | ICD-10-CM | POA: Insufficient documentation

## 2017-09-07 DIAGNOSIS — G479 Sleep disorder, unspecified: Secondary | ICD-10-CM

## 2017-09-07 LAB — COMPREHENSIVE METABOLIC PANEL
ALK PHOS: 47 U/L (ref 39–117)
ALT: 8 U/L (ref 0–53)
AST: 11 U/L (ref 0–37)
Albumin: 4.7 g/dL (ref 3.5–5.2)
BUN: 15 mg/dL (ref 6–23)
CO2: 31 meq/L (ref 19–32)
Calcium: 9.9 mg/dL (ref 8.4–10.5)
Chloride: 104 mEq/L (ref 96–112)
Creatinine, Ser: 0.84 mg/dL (ref 0.40–1.50)
GFR: 118.32 mL/min (ref >60.00)
Glucose, Bld: 95 mg/dL (ref 70–99)
POTASSIUM: 3.9 meq/L (ref 3.5–5.1)
SODIUM: 141 meq/L (ref 135–145)
TOTAL PROTEIN: 7.5 g/dL (ref 6.0–8.3)
Total Bilirubin: 1.5 mg/dL — ABNORMAL HIGH (ref 0.2–1.2)

## 2017-09-07 LAB — CBC WITH DIFFERENTIAL/PLATELET
Basophils Absolute: 0 10*3/uL (ref 0.0–0.1)
Basophils Relative: 0.2 % (ref 0.0–3.0)
EOS PCT: 1.9 % (ref 0.0–5.0)
Eosinophils Absolute: 0.2 10*3/uL (ref 0.0–0.7)
HCT: 45.4 % (ref 39.0–52.0)
Hemoglobin: 15.6 g/dL (ref 13.0–17.0)
Lymphocytes Relative: 7.4 % — ABNORMAL LOW (ref 12.0–46.0)
Lymphs Abs: 0.7 10*3/uL (ref 0.7–4.0)
MCHC: 34.3 g/dL (ref 30.0–36.0)
MCV: 86.3 fl (ref 78.0–100.0)
MONO ABS: 0.9 10*3/uL (ref 0.1–1.0)
Monocytes Relative: 9 % (ref 3.0–12.0)
NEUTROS ABS: 7.9 10*3/uL — AB (ref 1.4–7.7)
Neutrophils Relative %: 81.5 % — ABNORMAL HIGH (ref 43.0–77.0)
Platelets: 133 10*3/uL — ABNORMAL LOW (ref 150.0–400.0)
RBC: 5.26 Mil/uL (ref 4.22–5.81)
RDW: 12.7 % (ref 11.5–15.5)
WBC: 9.7 10*3/uL (ref 4.0–10.5)

## 2017-09-07 MED ORDER — TESTOSTERONE 20.25 MG/ACT (1.62%) TD GEL: TRANSDERMAL | 0 refills | Status: DC

## 2017-09-07 NOTE — Assessment & Plan Note (Signed)
Mild intermittent nosebleeds-left nostril Likely related to dryness from taking Zyrtec daily Has tried saline nasal spray and humidifier Advised applying Vaseline inside the left nostril

## 2017-09-07 NOTE — Assessment & Plan Note (Signed)
Take Xanax rarely We will continue

## 2017-09-07 NOTE — Patient Instructions (Addendum)
Call urology to schedule an appointment.    [facility] [address] [place] [facility] [place], [place] [id number] Phone:[phone] [phone]    Test(s) ordered today. Your results will be released to MyChart (or called to you) after review, usually within 72hours after test completion. If any changes need to be made, you will be notified at that same time.  An abdominal ultrasound was ordered.     Medications reviewed and updated.  No changes recommended at this time.  Your prescription(s) have been submitted to your pharmacy. Please take as directed and contact our office if you believe you are having problem(s) with the medication(s).   Please followup in 6 months

## 2017-09-07 NOTE — Assessment & Plan Note (Signed)
Intermittent, significant epigastric pain No GERD in between Possible gallbladder disease since it tends to occur after eating Abdominal ultrasound CBC, CMP

## 2017-09-07 NOTE — Assessment & Plan Note (Signed)
Doing 4 pumps a day Still not feeling great - still low energy Check testosterone level Was referred to urology - will give number so he can call

## 2017-09-10 LAB — TESTOSTERONE, FREE & TOTAL
Free Testosterone: 15.9 pg/mL — ABNORMAL LOW (ref 35.0–155.0)
TESTOSTERONE, TOTAL, LC-MS-MS: 108 ng/dL — AB (ref 250–1100)

## 2017-09-12 ENCOUNTER — Encounter: Payer: Self-pay | Admitting: Internal Medicine

## 2017-09-24 ENCOUNTER — Other Ambulatory Visit: Payer: PRIVATE HEALTH INSURANCE

## 2017-10-04 ENCOUNTER — Other Ambulatory Visit: Payer: PRIVATE HEALTH INSURANCE

## 2017-11-30 ENCOUNTER — Ambulatory Visit: Payer: PRIVATE HEALTH INSURANCE | Admitting: Internal Medicine

## 2017-12-07 ENCOUNTER — Encounter: Payer: PRIVATE HEALTH INSURANCE | Admitting: Internal Medicine

## 2017-12-08 ENCOUNTER — Other Ambulatory Visit: Payer: PRIVATE HEALTH INSURANCE

## 2017-12-16 ENCOUNTER — Encounter: Payer: Self-pay | Admitting: Internal Medicine

## 2017-12-16 ENCOUNTER — Ambulatory Visit
Admission: RE | Admit: 2017-12-16 | Discharge: 2017-12-16 | Disposition: A | Discharge status: Home / Self Care | Payer: PRIVATE HEALTH INSURANCE | Source: Ambulatory Visit | Attending: Internal Medicine | Admitting: Internal Medicine

## 2017-12-16 DIAGNOSIS — R1013 Epigastric pain: Secondary | ICD-10-CM

## 2018-02-08 ENCOUNTER — Other Ambulatory Visit: Payer: Self-pay | Admitting: Internal Medicine

## 2018-02-08 DIAGNOSIS — G479 Sleep disorder, unspecified: Secondary | ICD-10-CM

## 2018-02-08 NOTE — Telephone Encounter (Signed)
Last OV was 09/07/17 Last refill was 06/28/17 on testosterone and 03/12/16 on alprazolam

## 2018-02-09 ENCOUNTER — Other Ambulatory Visit: Payer: Self-pay | Admitting: Internal Medicine

## 2018-02-09 DIAGNOSIS — G479 Sleep disorder, unspecified: Secondary | ICD-10-CM

## 2018-02-09 MED ORDER — ALPRAZOLAM 0.5 MG PO TABS: 0.5000 mg | ORAL_TABLET | Freq: Every evening | ORAL | 0 refills | Status: DC | PRN

## 2018-02-09 MED ORDER — ALPRAZOLAM 0.5 MG PO TABS: 0.5000 mg | ORAL_TABLET | Freq: Every evening | ORAL | 0 refills | Status: AC | PRN

## 2018-02-09 MED ORDER — TESTOSTERONE 20.25 MG/ACT (1.62%) TD GEL: TRANSDERMAL | 0 refills | Status: AC

## 2018-03-13 NOTE — Progress Notes (Signed)
Subjective:    Patient ID: Kyle Davenport, male    DOB: 03/22/1990, 28 y.o.   MRN: [id number]  HPI      Medications and allergies reviewed with patient and updated if appropriate.  Patient Active Problem List   Diagnosis Date Noted  . Colicky epigastric pain 09/07/2017  . Left-sided nosebleed 09/07/2017  . Sleep difficulties 03/12/2016  . 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 Low testosterone 05/03/2015  . Neck discomfort 05/03/2015  . IBS (irritable bowel syndrome) 05/03/2015    Current Outpatient Medications on File Prior to Visit  Medication Sig Dispense Refill  . ALPRAZolam (XANAX) 0.5 MG tablet Take 1 tablet (0.5 mg total) by mouth at bedtime as needed for anxiety. 30 tablet 0  . cetirizine (ZYRTEC) 10 MG tablet Take 10 mg by mouth daily.    . ranitidine (ZANTAC) 15 MG/ML syrup Take 10 mLs (150 mg total) by mouth 2 (two) times daily as needed for heartburn. 473 mL 0  . Testosterone (ANDROGEL PUMP) 20.25 MG/ACT (1.62%) GEL Apply 3 pumps twice daily as directed 150 g 0   No current facility-administered medications on file prior to visit.     Past Medical History:  Diagnosis Date  . C. difficile colitis   . Depression   . GERD (gastroesophageal reflux disease)   . IBS (irritable bowel syndrome)   . Low testosterone     Past Surgical History:  Procedure Laterality Date  . APPENDECTOMY    . laperscopic look for Testes  1996  . testicle implants  2007    Social History   Socioeconomic History  . Marital status: Single    Spouse name: Not on file  . Number of children: Not on file  . Years of education: Not on file  . Highest education level: Not on file  Occupational History  . Not on file  Social Needs  . Financial resource strain: Not on file  . Food insecurity:    Worry: Not on file    Inability: Not on file  . Transportation needs:    Medical: Not on file    Non-medical: Not on file  Tobacco Use  . Smoking status: Former Games developermoker  .  Smokeless tobacco: Never Used  . Tobacco comment: rare 1-2 a week  Substance and Sexual Activity  . Alcohol use: Yes    Alcohol/week: 0.0 standard drinks    Comment: social  . Drug use: Yes    Types: Marijuana  . Sexual activity: Not on file  Lifestyle  . Physical activity:    Days per week: Not on file    Minutes per session: Not on file  . Stress: Not on file  Relationships  . Social connections:    Talks on phone: Not on file    Gets together: Not on file    Attends religious service: Not on file    Active member of club or organization: Not on file    Attends meetings of clubs or organizations: Not on file    Relationship status: Not on file  Other Topics Concern  . Not on file  Social History Narrative  . Not on file    Family History  Problem Relation Age of Onset  . Arthritis Maternal Aunt   . Hearing loss Maternal Uncle   . Hyperlipidemia Maternal Grandmother   . Hypertension Maternal Grandmother   . Alcohol abuse Maternal Grandfather   . Birth defects Maternal Grandfather        prost  .  Diabetes Maternal Grandfather   . Crohn's disease Brother   . Ulcerative colitis Paternal Grandmother     Review of Systems     Objective:  There were no vitals filed for this visit. There were no vitals filed for this visit. There is no height or weight on file to calculate BMI.  Wt Readings from Last 3 Encounters:  09/07/17 122 lb (55.3 kg)  06/04/17 124 lb (56.2 kg)  10/05/16 125 lb (56.7 kg)     Physical Exam        Assessment & Plan:     This encounter was created in error - please disregard.

## 2018-03-14 ENCOUNTER — Encounter: Payer: PRIVATE HEALTH INSURANCE | Admitting: Internal Medicine

## 2018-03-14 DIAGNOSIS — Z0289 Encounter for other administrative examinations: Secondary | ICD-10-CM

## 2019-06-27 ENCOUNTER — Telehealth: Payer: Self-pay

## 2019-06-27 ENCOUNTER — Other Ambulatory Visit: Payer: Self-pay | Admitting: Internal Medicine

## 2019-06-27 ENCOUNTER — Encounter: Payer: Self-pay | Admitting: Internal Medicine

## 2019-06-27 NOTE — Telephone Encounter (Signed)
New message   The patient is asking for a call back from the CMA or Dr. Lawerance Bach.  Moving out of [place]   1.Medication Requested:  Testosterone (ANDROGEL PUMP) 20.25 MG/ACT (1.62%) GEL  ALPRAZolam (XANAX) 0.5 MG tablet  cetirizine (ZYRTEC) 10 MG tablet  ranitidine (ZANTAC) 15 MG/ML syrup  2. Pharmacy (Name, Street, [place]): [facility] [address] , [place] phone # [phone]   3. On Med List: Yes   4. Last Visit with PCP: 7.19.2019  5. Next visit date with PCP: no appt is made at this time    Agent: Please be advised that RX refills may take up to 3 business days. We ask that you follow-up with your pharmacy.

## 2019-06-27 NOTE — Telephone Encounter (Signed)
Responded through mychart.

## 2019-06-27 NOTE — Telephone Encounter (Signed)
Check [facility] registry last filled 05/29/2018.Marland KitchenRaechel Chute

## 2019-08-23 IMAGING — US US ABDOMEN LIMITED
1 series · 14 of 25 positions shown · non-contrast
Comparison: None.

CLINICAL DATA: Episodic right upper quadrant pain

EXAM:
ULTRASOUND ABDOMEN LIMITED RIGHT UPPER QUADRANT

[Series 1: us abdomen limited · 0.19mm/px · 14 of 38 slices shown]
[im 1/38]
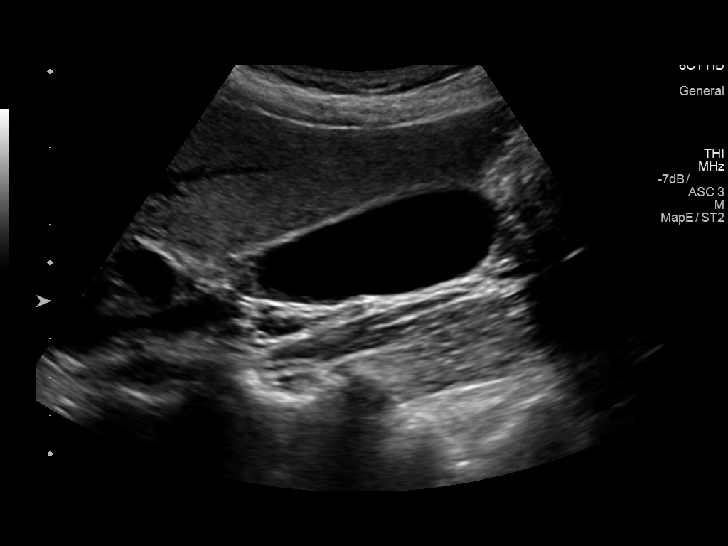
[im 4/38]
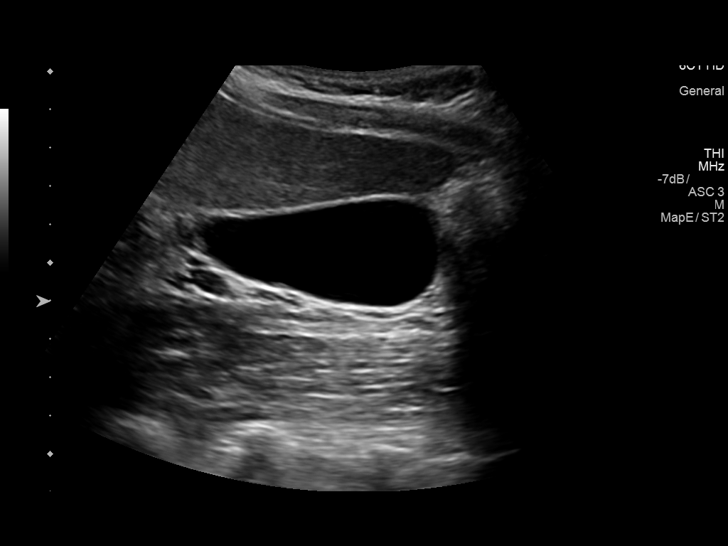
[im 7/38]
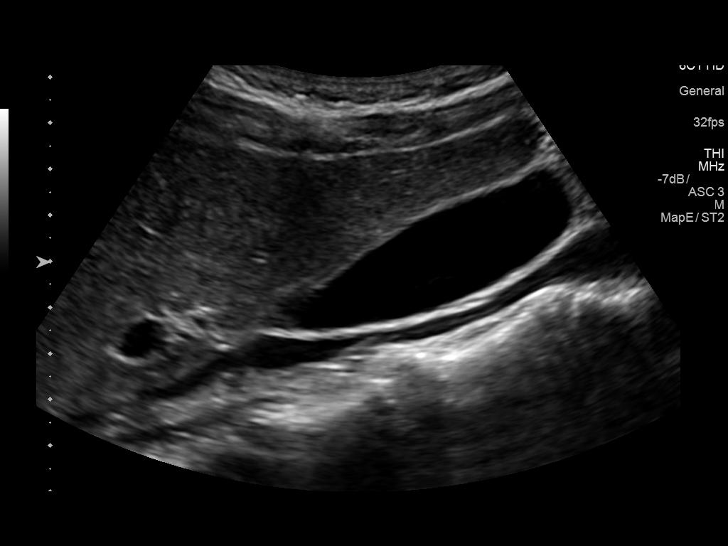
[im 10/38]
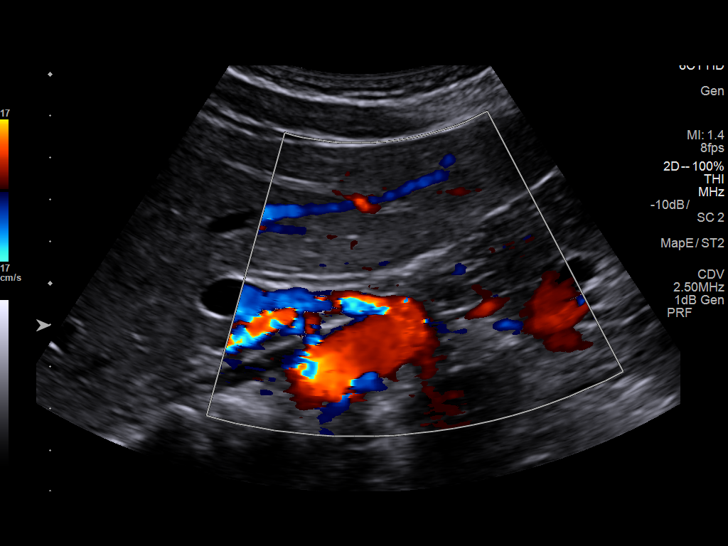
[im 13/38]
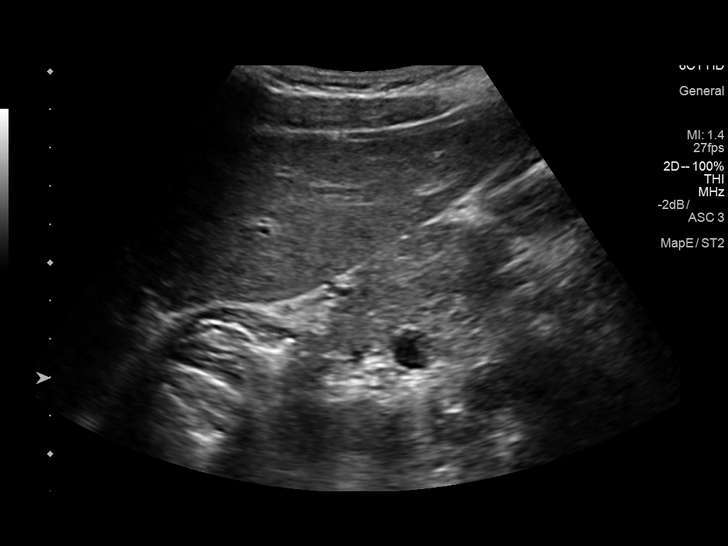
[im 14/38]
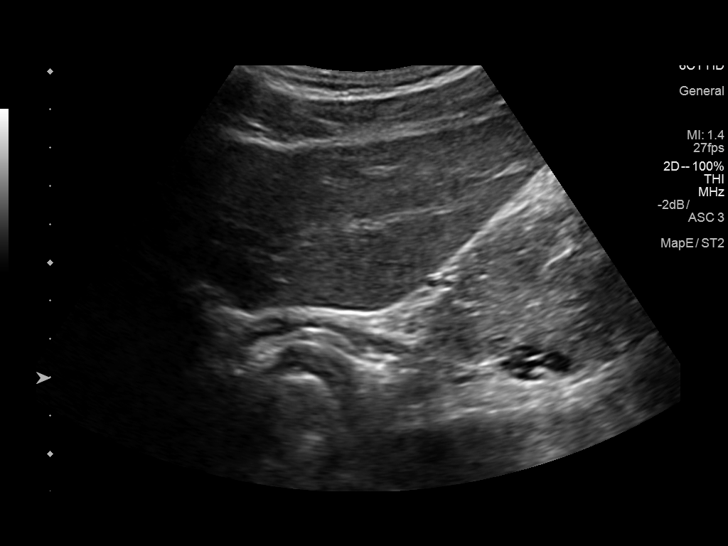
[im 17/38]
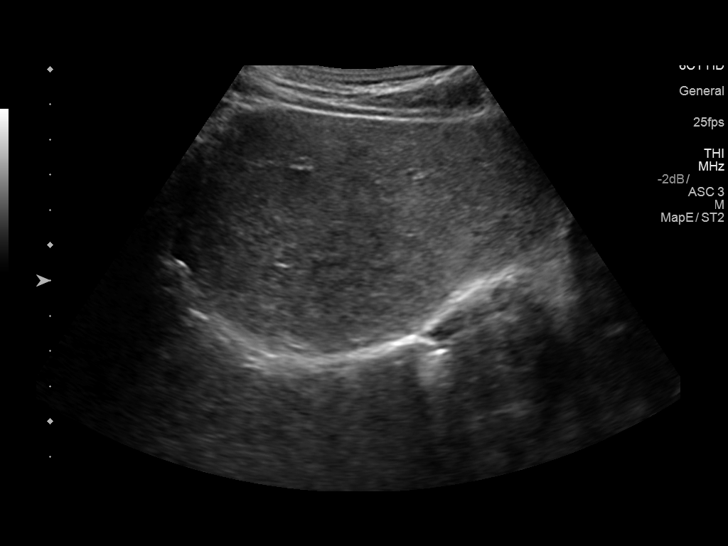
[im 21/38]
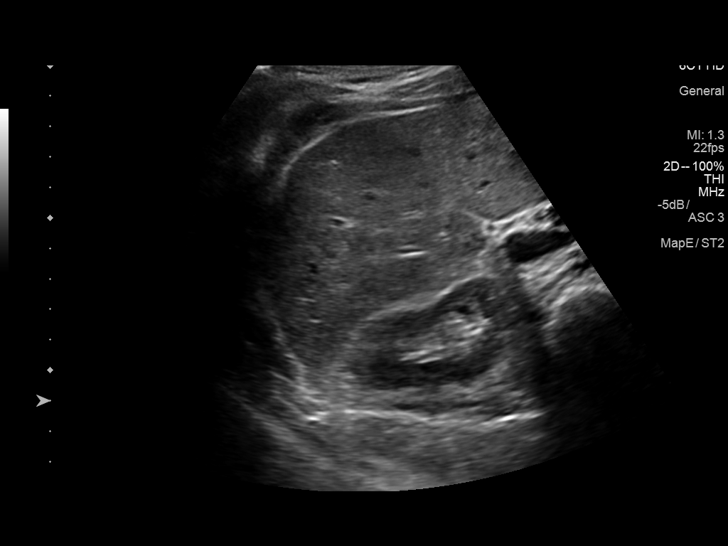
[im 24/38]
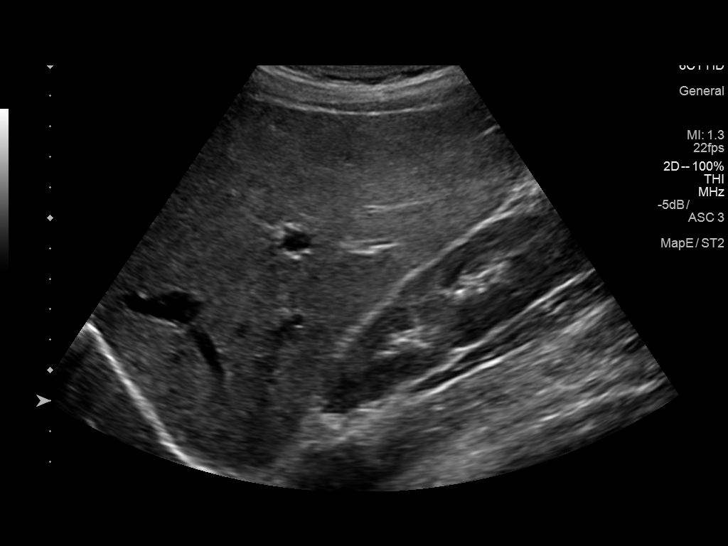
[im 25/38]
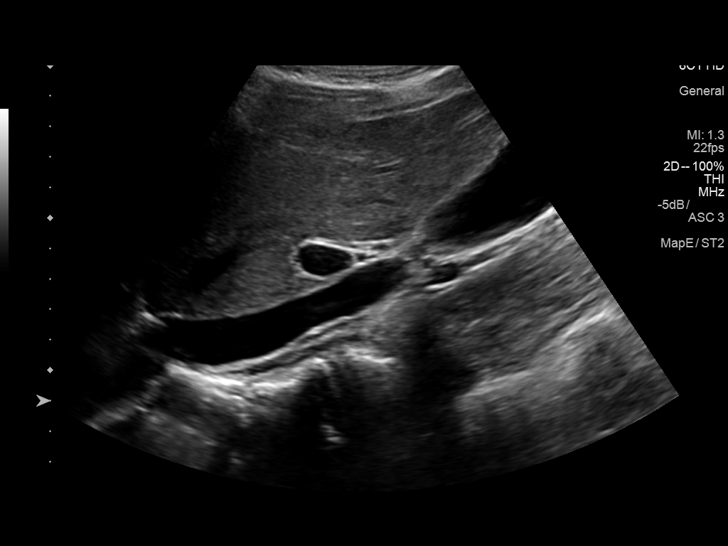
[im 28/38]
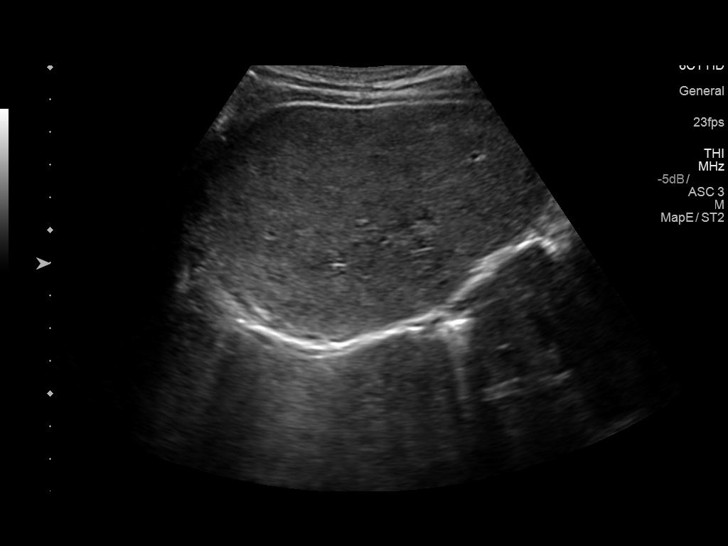
[im 31/38]
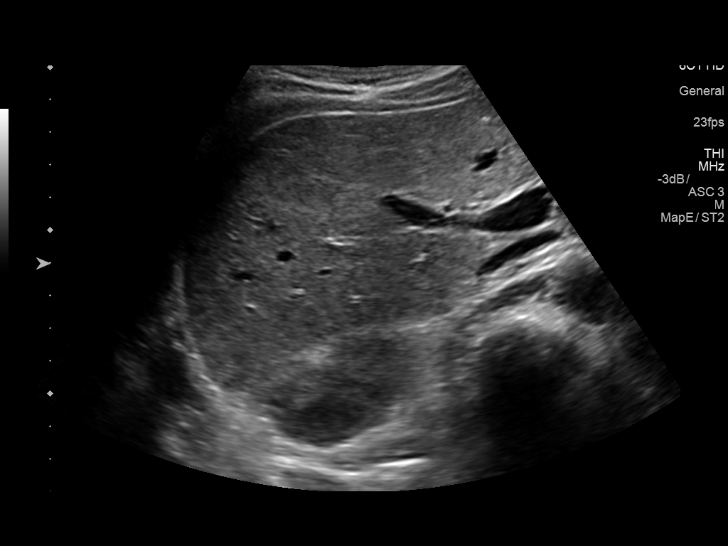
[im 34/38]
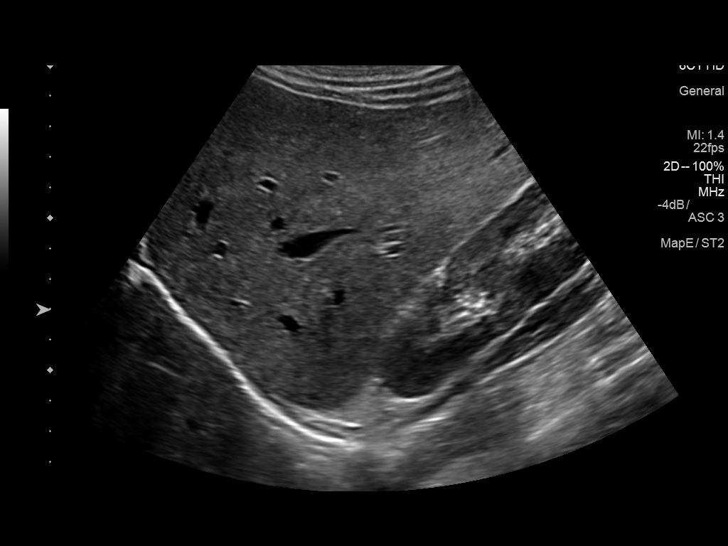
[im 38/38]
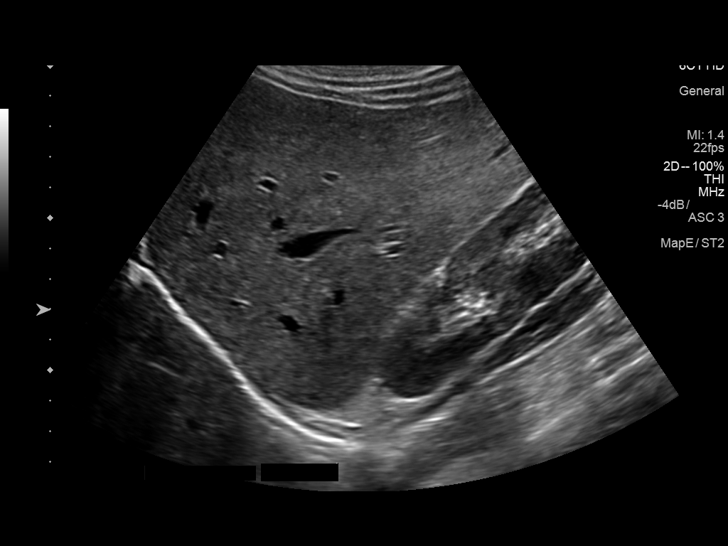

[14 of 25 positions shown; findings below may reference images not displayed]

FINDINGS: Gallbladder:

No gallstones or wall thickening visualized. No sonographic Murphy
sign noted by sonographer.

Common bile duct:

Diameter: 1.4 mm

Liver:

No focal lesion identified. Within normal limits in parenchymal
echogenicity. Portal vein is patent on color Doppler imaging with
normal direction of blood flow towards the liver.
IMPRESSION: No acute abnormality noted.
